# Patient Record
Sex: Female | Born: 1985 | Race: White | Hispanic: No | Marital: Single
Health system: Midwestern US, Community
[De-identification: ages and names within clinical notes are randomized; demographics above are authoritative.]

## PROBLEM LIST (undated history)

## (undated) DIAGNOSIS — I10 Essential (primary) hypertension: Secondary | ICD-10-CM

## (undated) HISTORY — PX: BACK SURGERY: SHX140

---

## 2017-05-02 ENCOUNTER — Emergency Department (HOSPITAL_COMMUNITY)
Admission: EM | Admit: 2017-05-02 | Discharge: 2017-05-02 | Disposition: A | Discharge status: Home / Self Care | Payer: 59 | Attending: Emergency Medicine | Admitting: Emergency Medicine

## 2017-05-02 ENCOUNTER — Other Ambulatory Visit: Payer: Self-pay

## 2017-05-02 ENCOUNTER — Encounter (HOSPITAL_COMMUNITY): Payer: Self-pay | Admitting: *Deleted

## 2017-05-02 DIAGNOSIS — K0889 Other specified disorders of teeth and supporting structures: Secondary | ICD-10-CM | POA: Insufficient documentation

## 2017-05-02 DIAGNOSIS — I10 Essential (primary) hypertension: Secondary | ICD-10-CM | POA: Diagnosis not present

## 2017-05-02 DIAGNOSIS — F17200 Nicotine dependence, unspecified, uncomplicated: Secondary | ICD-10-CM | POA: Insufficient documentation

## 2017-05-02 DIAGNOSIS — K047 Periapical abscess without sinus: Secondary | ICD-10-CM | POA: Diagnosis not present

## 2017-05-02 DIAGNOSIS — R220 Localized swelling, mass and lump, head: Secondary | ICD-10-CM | POA: Diagnosis not present

## 2017-05-02 HISTORY — DX: Essential (primary) hypertension: I10

## 2017-05-02 MED ORDER — OXYCODONE-ACETAMINOPHEN 5-325 MG PO TABS: 1.0000 | ORAL_TABLET | ORAL | 0 refills | Status: DC | PRN

## 2017-05-02 MED ORDER — IBUPROFEN 400 MG PO TABS
400.0000 mg | ORAL_TABLET | Freq: Once | ORAL | Status: AC | PRN
  Administered 2017-05-02: 400 mg via ORAL
  Filled 2017-05-02: qty 1

## 2017-05-02 MED ORDER — OXYCODONE-ACETAMINOPHEN 5-325 MG PO TABS
1.0000 | ORAL_TABLET | Freq: Once | ORAL | Status: AC
  Administered 2017-05-02: 1 via ORAL
  Filled 2017-05-02: qty 1

## 2017-05-02 MED ORDER — CLINDAMYCIN HCL 150 MG PO CAPS: 300.0000 mg | ORAL_CAPSULE | Freq: Three times a day (TID) | ORAL | 0 refills | Status: DC

## 2017-05-02 NOTE — ED Provider Notes (Signed)
[facility]PART[facility] Provider Note   CSN: [id number] Arrival date & time: 05/02/17  4235     History   Chief Complaint Chief Complaint  Patient presents with  . Facial Swelling    HPI Shannon Vang is a 32 y.o. female who presents for evaluation of dental pain and facial swelling.  Patient states that she has 3 broken teeth on the right lower jaw.  She awoke this morning with "grapefruit sized" swelling of the right lower jaw.  She states that the pain was severe.  She denies difficulty swallowing, fever.  She took 800 mg of ibuprofen and a placed ice and the swelling has decreased significantly.  She has a history of previous dental infections.  She is allergic to penicillins.  HPI  Past Medical History:  Diagnosis Date  . Hypertension     There are no active problems to display for this patient.   History reviewed. No pertinent surgical history.   OB History   None      Home Medications    Prior to Admission medications   Not on File    Family History No family history on file.  Social History Social History   Tobacco Use  . Smoking status: Current Every Day Smoker  . Smokeless tobacco: Never Used  Substance Use Topics  . Alcohol use: Yes  . Drug use: Not on file     Allergies   Penicillins   Review of Systems Review of Systems  Ten systems reviewed and are negative for acute change, except as noted in the HPI.   Physical Exam Updated Vital Signs BP (!) 149/106 (BP Location: Left Arm)   Pulse 89   Temp 99.6 F (37.6 C)   Resp 20   Ht 5\' 5"  (1.651 m)   Wt 83.9 kg (185 lb)   LMP 05/01/2017   SpO2 98%   BMI 30.79 kg/m   Physical Exam  Physical Exam  Nursing note and vitals reviewed. Constitutional: She is oriented to person, place, and time. She appears well-developed and well-nourished. No distress.  HENT:  Head: Normocephalic and atraumatic.  Mouth: Cracked right first second and third molar on the  lower jaw with dental decay.  There is significant swelling in the right mandibular region, tenderness to palpation at the apex of the lower gumline.  No overt abscess. Eyes: Conjunctivae normal and EOM are normal. Pupils are equal, round, and reactive to light. No scleral icterus.  Neck: Normal range of motion.  Cardiovascular: Normal rate, regular rhythm and normal heart sounds.  Exam reveals no gallop and no friction rub.   No murmur heard. Pulmonary/Chest: Effort normal and breath sounds normal. No respiratory distress.  Abdominal: Soft. Bowel sounds are normal. She exhibits no distension and no mass. There is no tenderness. There is no guarding.  Neurological: She is alert and oriented to person, place, and time.  Skin: Skin is warm and dry. She is not diaphoretic.    ED Treatments / Results  Labs (all labs ordered are listed, but only abnormal results are displayed) Labs Reviewed - No data to display  EKG None  Radiology No results found.  Procedures Procedures (including critical care time)  Medications Ordered in ED Medications  ibuprofen (ADVIL,MOTRIN) tablet 400 mg (400 mg Oral Given 05/02/17 3614)     Initial Impression / Assessment and Plan / ED Course  I have reviewed the triage vital signs and the nursing notes.  Pertinent labs & imaging  results that were available during my care of the patient were reviewed by me and considered in my medical decision making (see chart for details).     Patient with dental infection no abscess requiring immediate incision and drainage.  Exam not concerning for Ludwig's angina or pharyngeal abscess.  Will treat with clindamycin and Percocet. Pt instructed to follow-up with dentist.  Discussed return precautions. Pt safe for discharge.   Final Clinical Impressions(s) / ED Diagnoses   Final diagnoses:  Dental abscess    ED Discharge Orders    None       Margarita Mail, PA-C 05/02/17 1119    Duffy Bruce,  MD 05/03/17 1331

## 2017-05-02 NOTE — ED Triage Notes (Signed)
THE PT WOKE UP THIS AM WITH SWELLING RT LOWER FACE AND JAW.  SHE TOOK 800 MOTRIN AND THE SWELLING GRADUALLY BECAME SMALLER  SHE HAS 3 BROKEN TEETH ON THE RT LOWER BUT SHE HAS NOT HAD A TOOTHACHE  LMP  YESTERDAY

## 2017-05-02 NOTE — ED Notes (Signed)
Pt tearful. Pt states she is okay going to a hallway bed.

## 2017-05-02 NOTE — Discharge Instructions (Addendum)
You have been seen by your caregiver because of dental pain.  °SEEK MEDICAL ATTENTION IF: °The exam and treatment you received today has been provided on an emergency basis only. This is not a substitute for complete medical or dental care. If your problem worsens or new symptoms (problems) appear, and you are unable to arrange prompt follow-up care with your dentist, call or return to this location. °CALL YOUR DENTIST OR RETURN IMMEDIATELY IF you develop a fever, rash, difficulty breathing or swallowing, neck or facial swelling, or other potentially serious concerns. ° ° °[employer]  °[employer]  °[facility]  °[address]  °[place], [place] [address]  °Phone [phone]  °[facility] [employer] [facility] in [place], [place], exemplifies the Dental School?s vision to improve the health and quality of life of all [place]ians by creating leaders with a passion to care for the underserved and by leading the nation in community-based, service learning oral health education. °We are committed to offering comprehensive general dental services for adults, children and special needs patients in a safe, caring and professional setting. ° °Appointments: Our clinic is open Monday through Friday 8:00 a.m. until 5:00 p.m. The amount of time scheduled for an appointment depends on the patient?s specific needs. We ask that you keep your appointed time for care or provide 24-hour notice of all appointment changes. Parents or legal guardians must accompany minor children. ° °Payment for Services: Medicaid and other insurance plans are welcome. Payment for services is due when services are rendered and may be made by cash or credit card. If you have dental insurance, we will assist you with your claim submission.  ° °Emergencies: Emergency services will be provided Monday through Friday on a  walk-in basis. Please arrive early for emergency services. After hours emergency services will be provided for patients of record as required. ° °Services:  °Comprehensive General Dentistry  °Children?s Dentistry  °Oral Surgery - Extractions  °Root Canals  °Sealants and Tooth Colored Fillings  °Crowns and Bridges  °Dentures and Partial Dentures  °Implant Services  °Periodontal Services and Cleanings  °Cosmetic Tooth Whitening  °Digital Radiography  °3-D/[facility] Imaging ° ° °

## 2017-05-06 DIAGNOSIS — D2371 Other benign neoplasm of skin of right lower limb, including hip: Secondary | ICD-10-CM | POA: Diagnosis not present

## 2017-05-06 DIAGNOSIS — D2362 Other benign neoplasm of skin of left upper limb, including shoulder: Secondary | ICD-10-CM | POA: Diagnosis not present

## 2017-05-06 DIAGNOSIS — D2239 Melanocytic nevi of other parts of face: Secondary | ICD-10-CM | POA: Diagnosis not present

## 2017-05-06 DIAGNOSIS — D485 Neoplasm of uncertain behavior of skin: Secondary | ICD-10-CM | POA: Diagnosis not present

## 2017-05-07 DIAGNOSIS — R112 Nausea with vomiting, unspecified: Secondary | ICD-10-CM | POA: Diagnosis not present

## 2017-05-07 DIAGNOSIS — R197 Diarrhea, unspecified: Secondary | ICD-10-CM | POA: Diagnosis not present

## 2017-07-11 ENCOUNTER — Emergency Department (HOSPITAL_COMMUNITY)
Admission: EM | Admit: 2017-07-11 | Discharge: 2017-07-11 | Disposition: A | Discharge status: Home / Self Care | Payer: 59 | Attending: Emergency Medicine | Admitting: Emergency Medicine

## 2017-07-11 DIAGNOSIS — F17200 Nicotine dependence, unspecified, uncomplicated: Secondary | ICD-10-CM | POA: Insufficient documentation

## 2017-07-11 DIAGNOSIS — I10 Essential (primary) hypertension: Secondary | ICD-10-CM | POA: Diagnosis not present

## 2017-07-11 DIAGNOSIS — K0889 Other specified disorders of teeth and supporting structures: Secondary | ICD-10-CM | POA: Insufficient documentation

## 2017-07-11 MED ORDER — OXYCODONE-ACETAMINOPHEN 5-325 MG PO TABS: 1.0000 | ORAL_TABLET | Freq: Three times a day (TID) | ORAL | 0 refills | Status: DC | PRN

## 2017-07-11 MED ORDER — CLINDAMYCIN HCL 150 MG PO CAPS: 300.0000 mg | ORAL_CAPSULE | Freq: Three times a day (TID) | ORAL | 0 refills | Status: AC

## 2017-07-11 NOTE — ED Provider Notes (Signed)
[facility]PART[facility] Provider Note   CSN: [id number] Arrival date & time: 07/11/17  1216     History   Chief Complaint Chief Complaint  Patient presents with  . Dental Pain    HPI Shannon Vang is a 32 y.o. female with a past medical history of hypertension, who presents to ED for evaluation of acute onset right lower dental pain.  She was at work when she felt her to back molars break off.  She swallowed a piece of the tooth accidentally.  She has had ongoing dental issues.  She last saw her dentist 3 weeks ago.  She is trying to save up enough money for a deposit for several oral surgeries.  She denies any trouble breathing or trouble swallowing, trismus, drooling, fever, drainage from area, neck pain, erythema.  HPI  Past Medical History:  Diagnosis Date  . Hypertension     There are no active problems to display for this patient.   No past surgical history on file.   OB History   None      Home Medications    Prior to Admission medications   Medication Sig Start Date End Date Taking? Authorizing Provider  clindamycin (CLEOCIN) 150 MG capsule Take 2 capsules (300 mg total) by mouth 3 (three) times daily for 7 days. 07/11/17 07/18/17  Scotti Kosta, Nicanor Alcon, PA-C  oxyCODONE-acetaminophen (PERCOCET/ROXICET) 5-325 MG tablet Take 1 tablet by mouth every 8 (eight) hours as needed for severe pain. 07/11/17   Delia Heady, PA-C    Family History No family history on file.  Social History Social History   Tobacco Use  . Smoking status: Current Every Day Smoker  . Smokeless tobacco: Never Used  Substance Use Topics  . Alcohol use: Yes  . Drug use: Not on file     Allergies   Penicillins   Review of Systems Review of Systems  Constitutional: Negative for chills and fever.  HENT: Positive for dental problem. Negative for facial swelling, sore throat and trouble swallowing.   Respiratory: Negative for choking and shortness of breath.     Musculoskeletal: Negative for neck pain and neck stiffness.     Physical Exam Updated Vital Signs BP (!) 151/108 (BP Location: Left Arm)   Pulse 68   Temp 98.6 F (37 C) (Oral)   Resp 16   SpO2 100%   Physical Exam  Constitutional: She appears well-developed and well-nourished. No distress.  HENT:  Head: Normocephalic and atraumatic.  Mouth/Throat: Uvula is midline. She has dentures. No oral lesions. Abnormal dentition. No dental abscesses, uvula swelling or dental caries.    Overall poor dentition with several missing and chipped teeth noted. Lateral side of two back R lower molars are chipped. No gross dental abscess or site of drainage at this time. No facial, neck or cheek swelling noted. No pooling of secretions or trismus.  Normal voice noted with no difficulty swallowing or breathing. No submandibular swelling, erythema or crepitus noted.  Eyes: Conjunctivae and EOM are normal. No scleral icterus.  Neck: Normal range of motion.  Pulmonary/Chest: Effort normal. No respiratory distress.  Neurological: She is alert.  Skin: No rash noted. She is not diaphoretic.  Psychiatric: She has a normal mood and affect.  Nursing note and vitals reviewed.    ED Treatments / Results  Labs (all labs ordered are listed, but only abnormal results are displayed) Labs Reviewed - No data to display  EKG None  Radiology No results found.  Procedures  Dental Block Date/Time: 07/11/2017 1:39 PM Performed by: Delia Heady, PA-C Authorized by: Delia Heady, PA-C   Consent:    Consent obtained:  Verbal   Consent given by:  Patient   Risks discussed:  Allergic reaction, hematoma, infection, intravascular injection, nerve damage, pain, swelling and unsuccessful block Indications:    Indications: dental pain   Location:    Block type:  Inferior alveolar Procedure details (see MAR for exact dosages):    Syringe type:  Controlled syringe   Anesthetic injected:  Bupivacaine 0.5% WITH  epi   Injection procedure:  Anatomic landmarks identified Post-procedure details:    Outcome:  Anesthesia achieved   Patient tolerance of procedure:  Tolerated well, no immediate complications   (including critical care time)  Medications Ordered in ED Medications - No data to display   Initial Impression / Assessment and Plan / ED Course  I have reviewed the triage vital signs and the nursing notes.  Pertinent labs & imaging results that were available during my care of the patient were reviewed by me and considered in my medical decision making (see chart for details).     Patient with dentalgia. On exam, there is no evidence of a drainable abscess. No trismus, glossal elevation, unilateral tonsillar swelling. No evidence of retropharyngeal or peritonsillar abscess or Ludwig angina.   Patient given dental block with significant improvement in symptoms.  Will treat with  clindamycin and short course of pain medication. Pt instructed to follow-up with dentist as soon as possible.   Portions of this note were generated with [employer]. Dictation errors may occur despite best attempts at proofreading.   Final Clinical Impressions(s) / ED Diagnoses   Final diagnoses:  Pain, dental    ED Discharge Orders        Ordered    clindamycin (CLEOCIN) 150 MG capsule  3 times daily     07/11/17 1318    oxyCODONE-acetaminophen (PERCOCET/ROXICET) 5-325 MG tablet  Every 8 hours PRN     07/11/17 1318       Delia Heady, PA-C 07/11/17 1341    Carmin Muskrat, MD 07/12/17 1349

## 2017-07-11 NOTE — ED Triage Notes (Signed)
Patient to ED c/o R upper dental pain - reports she was at work and felt two back molars break off at the gum line with pain since. Denies trauma.

## 2017-07-11 NOTE — Discharge Instructions (Signed)
Return to ED for worsening symptoms trouble breathing or trouble swallowing, increased facial swelling, coughing up blood, chest pain or trouble moving.

## 2017-08-06 ENCOUNTER — Ambulatory Visit (HOSPITAL_COMMUNITY)
Admission: RE | Admit: 2017-08-06 | Discharge: 2017-08-06 | Disposition: A | Discharge status: Home / Self Care | Payer: 59 | Source: Ambulatory Visit | Attending: Family Medicine | Admitting: Family Medicine

## 2017-08-06 ENCOUNTER — Other Ambulatory Visit (HOSPITAL_COMMUNITY): Payer: Self-pay | Admitting: Family Medicine

## 2017-08-06 DIAGNOSIS — S99921A Unspecified injury of right foot, initial encounter: Secondary | ICD-10-CM | POA: Diagnosis not present

## 2017-08-06 DIAGNOSIS — M79674 Pain in right toe(s): Secondary | ICD-10-CM

## 2017-08-06 DIAGNOSIS — W208XXA Other cause of strike by thrown, projected or falling object, initial encounter: Secondary | ICD-10-CM | POA: Diagnosis not present

## 2017-08-23 ENCOUNTER — Other Ambulatory Visit: Payer: Self-pay | Admitting: Occupational Medicine

## 2017-08-23 ENCOUNTER — Ambulatory Visit: Payer: Self-pay

## 2017-08-23 DIAGNOSIS — M79674 Pain in right toe(s): Secondary | ICD-10-CM

## 2017-10-14 DIAGNOSIS — F1120 Opioid dependence, uncomplicated: Secondary | ICD-10-CM | POA: Diagnosis not present

## 2017-10-28 DIAGNOSIS — F1120 Opioid dependence, uncomplicated: Secondary | ICD-10-CM | POA: Diagnosis not present

## 2017-11-01 ENCOUNTER — Emergency Department (HOSPITAL_COMMUNITY): Payer: 59

## 2017-11-01 ENCOUNTER — Encounter (HOSPITAL_COMMUNITY): Payer: Self-pay | Admitting: *Deleted

## 2017-11-01 ENCOUNTER — Emergency Department (HOSPITAL_COMMUNITY)
Admission: EM | Admit: 2017-11-01 | Discharge: 2017-11-01 | Disposition: A | Discharge status: Home / Self Care | Payer: 59 | Attending: Emergency Medicine | Admitting: Emergency Medicine

## 2017-11-01 ENCOUNTER — Other Ambulatory Visit: Payer: Self-pay

## 2017-11-01 DIAGNOSIS — K0889 Other specified disorders of teeth and supporting structures: Secondary | ICD-10-CM | POA: Diagnosis not present

## 2017-11-01 DIAGNOSIS — J189 Pneumonia, unspecified organism: Secondary | ICD-10-CM | POA: Diagnosis not present

## 2017-11-01 DIAGNOSIS — J181 Lobar pneumonia, unspecified organism: Secondary | ICD-10-CM | POA: Insufficient documentation

## 2017-11-01 DIAGNOSIS — R221 Localized swelling, mass and lump, neck: Secondary | ICD-10-CM | POA: Diagnosis not present

## 2017-11-01 LAB — BASIC METABOLIC PANEL
Anion gap: 12 (ref 5–15)
BUN: 8 mg/dL (ref 6–20)
CHLORIDE: 100 mmol/L (ref 98–111)
CO2: 25 mmol/L (ref 22–32)
Calcium: 9.2 mg/dL (ref 8.9–10.3)
Creatinine, Ser: 0.71 mg/dL (ref 0.44–1.00)
GFR calc non Af Amer: >60 mL/min (ref >60)
GLUCOSE: 93 mg/dL (ref 70–99)
Potassium: 3.4 mmol/L — ABNORMAL LOW (ref 3.5–5.1)
Sodium: 137 mmol/L (ref 135–145)

## 2017-11-01 LAB — CBC WITH DIFFERENTIAL/PLATELET
Abs Immature Granulocytes: 0.03 10*3/uL (ref 0.00–0.07)
BASOS ABS: 0.1 10*3/uL (ref 0.0–0.1)
Basophils Relative: 1 %
EOS PCT: 3 %
Eosinophils Absolute: 0.3 10*3/uL (ref 0.0–0.5)
HEMATOCRIT: 38.6 % (ref 36.0–46.0)
HEMOGLOBIN: 11.8 g/dL — AB (ref 12.0–15.0)
Immature Granulocytes: 0 %
LYMPHS ABS: 3.2 10*3/uL (ref 0.7–4.0)
LYMPHS PCT: 32 %
MCH: 26.3 pg (ref 26.0–34.0)
MCHC: 30.6 g/dL (ref 30.0–36.0)
MCV: 86.2 fL (ref 80.0–100.0)
MONO ABS: 0.5 10*3/uL (ref 0.1–1.0)
MONOS PCT: 6 %
Neutro Abs: 5.9 10*3/uL (ref 1.7–7.7)
Neutrophils Relative %: 58 %
Platelets: 438 10*3/uL — ABNORMAL HIGH (ref 150–400)
RBC: 4.48 MIL/uL (ref 3.87–5.11)
RDW: 13.4 % (ref 11.5–15.5)
WBC: 9.9 10*3/uL (ref 4.0–10.5)
nRBC: 0 % (ref 0.0–0.2)

## 2017-11-01 LAB — I-STAT BETA HCG BLOOD, ED (MC, WL, AP ONLY)

## 2017-11-01 LAB — I-STAT CREATININE, ED: Creatinine, Ser: 0.6 mg/dL (ref 0.44–1.00)

## 2017-11-01 MED ORDER — IOHEXOL 300 MG/ML  SOLN
100.0000 mL | Freq: Once | INTRAMUSCULAR | Status: AC | PRN
  Administered 2017-11-01: 75 mL via INTRAVENOUS

## 2017-11-01 MED ORDER — AMOXICILLIN-POT CLAVULANATE 875-125 MG PO TABS: 1.0000 | ORAL_TABLET | Freq: Two times a day (BID) | ORAL | 0 refills | Status: DC

## 2017-11-01 MED ORDER — OXYCODONE-ACETAMINOPHEN 5-325 MG PO TABS: 1.0000 | ORAL_TABLET | Freq: Four times a day (QID) | ORAL | 0 refills | Status: DC | PRN

## 2017-11-01 MED ORDER — KETOROLAC TROMETHAMINE 30 MG/ML IJ SOLN
15.0000 mg | Freq: Once | INTRAMUSCULAR | Status: AC
Start: 2017-11-01 — End: 2017-11-01
  Administered 2017-11-01: 15 mg via INTRAVENOUS
  Filled 2017-11-01: qty 1

## 2017-11-01 NOTE — ED Triage Notes (Signed)
Pt reports right side dental pain, now has swelling to her jaw and feels like it is spreading further down and now has difficulty swallowing. Airway intact.

## 2017-11-01 NOTE — ED Provider Notes (Signed)
Patient placed in Quick Look pathway, seen and evaluated   Chief Complaint: Dental pain  HPI:   Patient presents today for evaluation of dental pain.  She has a plan with her oral surgeon to have multiple teeth moved.  She is not currently on any antibiotics.  Over the past few days she has had worsening swelling on the right lower jaw.  She denies fevers.  She reports feeling like she has a tennis ball in her throat and significant pain with swallowing.  ROS: No fevers (one)  Physical Exam:   Gen: No distress  Neuro: Awake and Alert  Skin: Warm    Focused Exam: Broken tooth on left mandible.  There is edema under the jaw.  Mild trismus.  No stridor.    Initiation of care has begun. The patient has been counseled on the process, plan, and necessity for staying for the completion/evaluation, and the remainder of the medical screening examination    Ollen Gross 11/01/17 1913    Gareth Morgan, MD 11/03/17 2133

## 2017-11-01 NOTE — Discharge Instructions (Addendum)
Please read attached information. If you experience any new or worsening signs or symptoms please return to the emergency room for evaluation. Please follow-up with your primary care provider or specialist as discussed. Please use medication prescribed only as directed and discontinue taking if you have any concerning signs or symptoms.   °

## 2017-11-02 MED FILL — AMOX-CLAV 875-125 MG TABLET: 875-125 | 7 days supply | Qty: 14 | Fill #0

## 2017-11-02 MED FILL — OXYCODONE-ACETAMINOPHEN 5-3: 5-325 | 2 days supply | Qty: 6 | Fill #0

## 2017-11-08 MED FILL — ACETAMINOPHEN/COD #3 TABLET: 300-30 | 3 days supply | Qty: 20 | Fill #0

## 2017-11-08 MED FILL — CLINDAMYCIN HCL 300 MG CAP: 300 | 7 days supply | Qty: 28 | Fill #0

## 2017-11-08 MED FILL — IBUPROFEN 800 MG TAB: 800 | 10 days supply | Qty: 30 | Fill #0

## 2017-11-09 MED FILL — HYDROCODON-APAP 5-325: 5-325 | 3 days supply | Qty: 20 | Fill #0

## 2017-11-11 DIAGNOSIS — F1120 Opioid dependence, uncomplicated: Secondary | ICD-10-CM | POA: Diagnosis not present

## 2017-11-12 DIAGNOSIS — F1120 Opioid dependence, uncomplicated: Secondary | ICD-10-CM | POA: Diagnosis not present

## 2017-11-14 DIAGNOSIS — F1120 Opioid dependence, uncomplicated: Secondary | ICD-10-CM | POA: Diagnosis not present

## 2017-11-21 DIAGNOSIS — F1120 Opioid dependence, uncomplicated: Secondary | ICD-10-CM | POA: Diagnosis not present

## 2017-11-25 DIAGNOSIS — F1120 Opioid dependence, uncomplicated: Secondary | ICD-10-CM | POA: Diagnosis not present

## 2017-12-05 DIAGNOSIS — F1120 Opioid dependence, uncomplicated: Secondary | ICD-10-CM | POA: Diagnosis not present

## 2017-12-09 DIAGNOSIS — F1120 Opioid dependence, uncomplicated: Secondary | ICD-10-CM | POA: Diagnosis not present

## 2017-12-19 DIAGNOSIS — F1120 Opioid dependence, uncomplicated: Secondary | ICD-10-CM | POA: Diagnosis not present

## 2017-12-23 DIAGNOSIS — F1120 Opioid dependence, uncomplicated: Secondary | ICD-10-CM | POA: Diagnosis not present

## 2018-01-09 DIAGNOSIS — F1120 Opioid dependence, uncomplicated: Secondary | ICD-10-CM | POA: Diagnosis not present

## 2018-01-16 DIAGNOSIS — F1120 Opioid dependence, uncomplicated: Secondary | ICD-10-CM | POA: Diagnosis not present

## 2018-01-23 DIAGNOSIS — F1120 Opioid dependence, uncomplicated: Secondary | ICD-10-CM | POA: Diagnosis not present

## 2018-01-30 DIAGNOSIS — F1120 Opioid dependence, uncomplicated: Secondary | ICD-10-CM | POA: Diagnosis not present

## 2018-02-01 ENCOUNTER — Encounter (HOSPITAL_COMMUNITY): Payer: Self-pay

## 2018-02-01 ENCOUNTER — Ambulatory Visit (HOSPITAL_COMMUNITY)
Admission: EM | Admit: 2018-02-01 | Discharge: 2018-02-01 | Disposition: A | Discharge status: Home / Self Care | Payer: 59 | Attending: Family Medicine | Admitting: Family Medicine

## 2018-02-01 DIAGNOSIS — K047 Periapical abscess without sinus: Secondary | ICD-10-CM | POA: Insufficient documentation

## 2018-02-01 MED ORDER — OXYCODONE-ACETAMINOPHEN 5-325 MG PO TABS: 1.0000 | ORAL_TABLET | Freq: Four times a day (QID) | ORAL | 0 refills | Status: DC | PRN

## 2018-02-01 MED ORDER — CEFDINIR 300 MG PO CAPS: 600.0000 mg | ORAL_CAPSULE | Freq: Every day | ORAL | 0 refills | Status: DC

## 2018-02-01 MED FILL — OXYCODONE-ACETAMINOPHEN 5-3: 5-325 | 2 days supply | Qty: 8 | Fill #0

## 2018-02-01 MED FILL — CEFDINIR 300 MG CAPSULE: 300 | 10 days supply | Qty: 20 | Fill #0

## 2018-02-01 NOTE — Discharge Instructions (Addendum)
Keep your appointment with [facility] on Thursday

## 2018-02-01 NOTE — ED Triage Notes (Signed)
Pt present abscess on both sides of her mouth from tooth pain. Symptoms started on Sunday evening. Pt has tried OTC medication with no relief.

## 2018-02-01 NOTE — ED Provider Notes (Signed)
[facility]    CSN: [id number] Arrival date & time: 02/01/18  1112     History   Chief Complaint Chief Complaint  Patient presents with  . Abscess    Dental problems    HPI Shannon Vang is a 33 y.o. female.   33 yo woman making her initial visit to [facility], this time for dental problem.  Allergic to PCN.  Patient had 2 teeth extracted from her right lower jaw couple weeks ago.  She seemed to be getting better until last night when she developed increasing swelling and pain in the lower jaw molar area.  She can barely talk now, having difficulty opening up her mouth.  She called her oral surgeon but they refused to see her until Thursday.     Past Medical History:  Diagnosis Date  . Hypertension     There are no active problems to display for this patient.   History reviewed. No pertinent surgical history.  OB History   No obstetric history on file.      Home Medications    Prior to Admission medications   Medication Sig Start Date End Date Taking? Authorizing Provider  cefdinir (OMNICEF) 300 MG capsule Take 2 capsules (600 mg total) by mouth daily. 02/01/18   Robyn Haber, MD  oxyCODONE-acetaminophen (PERCOCET/ROXICET) 5-325 MG tablet Take 1 tablet by mouth every 6 (six) hours as needed for severe pain. 02/01/18   Robyn Haber, MD    Family History History reviewed. No pertinent family history.  Social History Social History   Tobacco Use  . Smoking status: Current Every Day Smoker  . Smokeless tobacco: Never Used  Substance Use Topics  . Alcohol use: Yes  . Drug use: Not on file     Allergies   Penicillins   Review of Systems Review of Systems   Physical Exam Triage Vital Signs ED Triage Vitals  Enc Vitals Group     BP 02/01/18 1207 (!) 133/94     Pulse Rate 02/01/18 1207 (!) 134     Resp 02/01/18 1207 16     Temp 02/01/18 1207 99.8 F (37.7 C)     Temp Source 02/01/18 1207 Oral     SpO2 02/01/18 1207 100 %   Weight --      Height --      Head Circumference --      Peak Flow --      Pain Score 02/01/18 1208 9     Pain Loc --      Pain Edu? --      Excl. in [place]? --    No data found.  Updated Vital Signs BP (!) 133/94 (BP Location: Right Arm)   Pulse (!) 134   Temp 99.8 F (37.7 C) (Oral)   Resp 16   LMP 01/05/2018   SpO2 100%    Physical Exam Vitals signs and nursing note reviewed.  Constitutional:      General: She is in acute distress.     Appearance: She is not toxic-appearing.  HENT:     Head:     Comments: Swollen right jaw with trismus    Right Ear: Tympanic membrane normal.     Nose: Nose normal.     Mouth/Throat:     Mouth: Mucous membranes are moist.     Pharynx: Oropharynx is clear. Posterior oropharyngeal erythema present.  Eyes:     Conjunctiva/sclera: Conjunctivae normal.  Cardiovascular:     Rate and Rhythm: Normal rate.  Pulmonary:     Effort: Pulmonary effort is normal.  Musculoskeletal: Normal range of motion.  Skin:    General: Skin is warm and dry.  Neurological:     General: No focal deficit present.     Mental Status: She is alert and oriented to person, place, and time.      UC Treatments / Results  Labs (all labs ordered are listed, but only abnormal results are displayed) Labs Reviewed - No data to display  EKG None  Radiology No results found.  Procedures Procedures (including critical care time)  Medications Ordered in UC Medications - No data to display  Initial Impression / Assessment and Plan / UC Course  I have reviewed the triage vital signs and the nursing notes.  Pertinent labs & imaging results that were available during my care of the patient were reviewed by me and considered in my medical decision making (see chart for details).    Final Clinical Impressions(s) / UC Diagnoses   Final diagnoses:  Dental abscess     Discharge Instructions     Keep your appointment with [facility] on Thursday    ED  Prescriptions    Medication Sig Dispense Auth. Provider   oxyCODONE-acetaminophen (PERCOCET/ROXICET) 5-325 MG tablet Take 1 tablet by mouth every 6 (six) hours as needed for severe pain. 8 tablet Robyn Haber, MD   cefdinir (OMNICEF) 300 MG capsule Take 2 capsules (600 mg total) by mouth daily. 20 capsule Robyn Haber, MD     Controlled Substance Prescriptions [place] Controlled Substance Registry consulted? Not Applicable   Robyn Haber, MD 02/01/18 1225

## 2018-02-04 DIAGNOSIS — D2371 Other benign neoplasm of skin of right lower limb, including hip: Secondary | ICD-10-CM | POA: Diagnosis not present

## 2018-02-06 DIAGNOSIS — F1120 Opioid dependence, uncomplicated: Secondary | ICD-10-CM | POA: Diagnosis not present

## 2018-02-13 DIAGNOSIS — F1120 Opioid dependence, uncomplicated: Secondary | ICD-10-CM | POA: Diagnosis not present

## 2018-02-17 DIAGNOSIS — F1120 Opioid dependence, uncomplicated: Secondary | ICD-10-CM | POA: Diagnosis not present

## 2018-02-20 DIAGNOSIS — F1120 Opioid dependence, uncomplicated: Secondary | ICD-10-CM | POA: Diagnosis not present

## 2018-02-27 DIAGNOSIS — F1120 Opioid dependence, uncomplicated: Secondary | ICD-10-CM | POA: Diagnosis not present

## 2018-03-03 ENCOUNTER — Emergency Department (HOSPITAL_COMMUNITY): Payer: 59

## 2018-03-03 ENCOUNTER — Observation Stay (HOSPITAL_COMMUNITY): Payer: 59

## 2018-03-03 ENCOUNTER — Encounter: Payer: Self-pay | Admitting: Emergency Medicine

## 2018-03-03 ENCOUNTER — Observation Stay (HOSPITAL_COMMUNITY)
Admission: EM | Admit: 2018-03-03 | Discharge: 2018-03-04 | Disposition: A | Discharge status: Home / Self Care | Payer: 59 | Attending: Family Medicine | Admitting: Family Medicine

## 2018-03-03 DIAGNOSIS — G039 Meningitis, unspecified: Secondary | ICD-10-CM | POA: Diagnosis not present

## 2018-03-03 DIAGNOSIS — Z79899 Other long term (current) drug therapy: Secondary | ICD-10-CM | POA: Diagnosis not present

## 2018-03-03 DIAGNOSIS — R509 Fever, unspecified: Secondary | ICD-10-CM | POA: Diagnosis not present

## 2018-03-03 DIAGNOSIS — M436 Torticollis: Secondary | ICD-10-CM | POA: Diagnosis present

## 2018-03-03 DIAGNOSIS — R000 Tachycardia, unspecified: Secondary | ICD-10-CM | POA: Diagnosis not present

## 2018-03-03 DIAGNOSIS — M542 Cervicalgia: Secondary | ICD-10-CM

## 2018-03-03 DIAGNOSIS — R7989 Other specified abnormal findings of blood chemistry: Secondary | ICD-10-CM | POA: Insufficient documentation

## 2018-03-03 DIAGNOSIS — Z8673 Personal history of transient ischemic attack (TIA), and cerebral infarction without residual deficits: Secondary | ICD-10-CM | POA: Diagnosis not present

## 2018-03-03 DIAGNOSIS — I10 Essential (primary) hypertension: Secondary | ICD-10-CM | POA: Diagnosis not present

## 2018-03-03 DIAGNOSIS — Z880 Allergy status to penicillin: Secondary | ICD-10-CM | POA: Insufficient documentation

## 2018-03-03 DIAGNOSIS — R51 Headache: Secondary | ICD-10-CM

## 2018-03-03 DIAGNOSIS — Z8249 Family history of ischemic heart disease and other diseases of the circulatory system: Secondary | ICD-10-CM | POA: Insufficient documentation

## 2018-03-03 DIAGNOSIS — F17200 Nicotine dependence, unspecified, uncomplicated: Secondary | ICD-10-CM | POA: Diagnosis not present

## 2018-03-03 DIAGNOSIS — D72829 Elevated white blood cell count, unspecified: Secondary | ICD-10-CM | POA: Diagnosis not present

## 2018-03-03 DIAGNOSIS — R519 Headache, unspecified: Secondary | ICD-10-CM

## 2018-03-03 DIAGNOSIS — M2560 Stiffness of unspecified joint, not elsewhere classified: Secondary | ICD-10-CM | POA: Diagnosis not present

## 2018-03-03 DIAGNOSIS — R69 Illness, unspecified: Secondary | ICD-10-CM | POA: Diagnosis not present

## 2018-03-03 DIAGNOSIS — G43909 Migraine, unspecified, not intractable, without status migrainosus: Secondary | ICD-10-CM | POA: Diagnosis not present

## 2018-03-03 DIAGNOSIS — I4891 Unspecified atrial fibrillation: Secondary | ICD-10-CM | POA: Insufficient documentation

## 2018-03-03 LAB — COMPREHENSIVE METABOLIC PANEL
ALBUMIN: 3.8 g/dL (ref 3.5–5.0)
ALT: 17 U/L (ref 0–44)
AST: 19 U/L (ref 15–41)
Alkaline Phosphatase: 87 U/L (ref 38–126)
Anion gap: 10 (ref 5–15)
BILIRUBIN TOTAL: 0.8 mg/dL (ref 0.3–1.2)
BUN: 11 mg/dL (ref 6–20)
CHLORIDE: 101 mmol/L (ref 98–111)
CO2: 24 mmol/L (ref 22–32)
Calcium: 9.2 mg/dL (ref 8.9–10.3)
Creatinine, Ser: 0.81 mg/dL (ref 0.44–1.00)
GFR calc Af Amer: >60 mL/min (ref >60)
GFR calc non Af Amer: >60 mL/min (ref >60)
GLUCOSE: 104 mg/dL — AB (ref 70–99)
POTASSIUM: 3.5 mmol/L (ref 3.5–5.1)
SODIUM: 135 mmol/L (ref 135–145)
Total Protein: 6.8 g/dL (ref 6.5–8.1)

## 2018-03-03 LAB — CBC WITH DIFFERENTIAL/PLATELET
Abs Immature Granulocytes: 0.08 10*3/uL — ABNORMAL HIGH (ref 0.00–0.07)
Basophils Absolute: 0.1 10*3/uL (ref 0.0–0.1)
Basophils Relative: 0 %
Eosinophils Absolute: 0.2 10*3/uL (ref 0.0–0.5)
Eosinophils Relative: 1 %
HEMATOCRIT: 40.9 % (ref 36.0–46.0)
Hemoglobin: 13.1 g/dL (ref 12.0–15.0)
IMMATURE GRANULOCYTES: 0 %
LYMPHS ABS: 3.6 10*3/uL (ref 0.7–4.0)
Lymphocytes Relative: 20 %
MCH: 26.7 pg (ref 26.0–34.0)
MCHC: 32 g/dL (ref 30.0–36.0)
MCV: 83.5 fL (ref 80.0–100.0)
MONO ABS: 0.7 10*3/uL (ref 0.1–1.0)
MONOS PCT: 4 %
NEUTROS PCT: 75 %
Neutro Abs: 13.8 10*3/uL — ABNORMAL HIGH (ref 1.7–7.7)
PLATELETS: 449 10*3/uL — AB (ref 150–400)
RBC: 4.9 MIL/uL (ref 3.87–5.11)
RDW: 13.3 % (ref 11.5–15.5)
WBC: 18.5 10*3/uL — ABNORMAL HIGH (ref 4.0–10.5)
nRBC: 0 % (ref 0.0–0.2)

## 2018-03-03 LAB — URINALYSIS, ROUTINE W REFLEX MICROSCOPIC
Bilirubin Urine: NEGATIVE
GLUCOSE, UA: NEGATIVE mg/dL
HGB URINE DIPSTICK: NEGATIVE
Ketones, ur: NEGATIVE mg/dL
Leukocytes,Ua: NEGATIVE
Nitrite: NEGATIVE
Protein, ur: NEGATIVE mg/dL
Specific Gravity, Urine: 1.02 (ref 1.005–1.030)
pH: 5 (ref 5.0–8.0)

## 2018-03-03 LAB — LACTIC ACID, PLASMA: Lactic Acid, Venous: 1.9 mmol/L (ref 0.5–1.9)

## 2018-03-03 LAB — PREGNANCY, URINE: Preg Test, Ur: NEGATIVE

## 2018-03-03 LAB — LIPASE, BLOOD: Lipase: 21 U/L (ref 11–51)

## 2018-03-03 MED ORDER — SODIUM CHLORIDE 0.9 % IV SOLN
2.0000 g | Freq: Once | INTRAVENOUS | Status: AC
  Administered 2018-03-04: 2 g via INTRAVENOUS
  Filled 2018-03-03: qty 20

## 2018-03-03 MED ORDER — PROCHLORPERAZINE EDISYLATE 10 MG/2ML IJ SOLN
10.0000 mg | Freq: Once | INTRAMUSCULAR | Status: AC
  Administered 2018-03-03: 10 mg via INTRAVENOUS
  Filled 2018-03-03: qty 2

## 2018-03-03 MED ORDER — ACETAMINOPHEN 325 MG PO TABS: 650.0000 mg | ORAL_TABLET | Freq: Four times a day (QID) | ORAL | Status: DC | PRN

## 2018-03-03 MED ORDER — VANCOMYCIN HCL IN DEXTROSE 1-5 GM/200ML-% IV SOLN: 1000.0000 mg | Freq: Once | INTRAVENOUS | Status: DC

## 2018-03-03 MED ORDER — ONDANSETRON HCL 4 MG/2ML IJ SOLN: 4.0000 mg | Freq: Four times a day (QID) | INTRAMUSCULAR | Status: DC | PRN

## 2018-03-03 MED ORDER — BISACODYL 5 MG PO TBEC: 5.0000 mg | DELAYED_RELEASE_TABLET | Freq: Every day | ORAL | Status: DC | PRN

## 2018-03-03 MED ORDER — ONDANSETRON HCL 4 MG PO TABS: 4.0000 mg | ORAL_TABLET | Freq: Four times a day (QID) | ORAL | Status: DC | PRN

## 2018-03-03 MED ORDER — VANCOMYCIN HCL 10 G IV SOLR
2000.0000 mg | Freq: Once | INTRAVENOUS | Status: AC
  Administered 2018-03-03: 2000 mg via INTRAVENOUS
  Filled 2018-03-03: qty 2000

## 2018-03-03 MED ORDER — HYDRALAZINE HCL 20 MG/ML IJ SOLN: 10.0000 mg | INTRAMUSCULAR | Status: DC | PRN

## 2018-03-03 MED ORDER — HYDROMORPHONE HCL 2 MG PO TABS: 4.0000 mg | ORAL_TABLET | Freq: Four times a day (QID) | ORAL | Status: DC | PRN

## 2018-03-03 MED ORDER — SODIUM CHLORIDE 0.9 % IV BOLUS
1000.0000 mL | Freq: Once | INTRAVENOUS | Status: AC
  Administered 2018-03-03: 1000 mL via INTRAVENOUS

## 2018-03-03 MED ORDER — SODIUM CHLORIDE 0.9 % IV SOLN
2.0000 g | Freq: Two times a day (BID) | INTRAVENOUS | Status: DC
  Filled 2018-03-03: qty 20

## 2018-03-03 MED ORDER — POTASSIUM CHLORIDE IN NACL 20-0.45 MEQ/L-% IV SOLN: INTRAVENOUS | Status: DC

## 2018-03-03 MED ORDER — IOPAMIDOL (ISOVUE-370) INJECTION 76%
75.0000 mL | Freq: Once | INTRAVENOUS | Status: AC | PRN
  Administered 2018-03-03: 75 mL via INTRAVENOUS

## 2018-03-03 MED ORDER — SODIUM CHLORIDE 0.9 % IV BOLUS
500.0000 mL | Freq: Once | INTRAVENOUS | Status: AC
  Administered 2018-03-04: 500 mL via INTRAVENOUS

## 2018-03-03 MED ORDER — ACETAMINOPHEN 650 MG RE SUPP: 650.0000 mg | Freq: Four times a day (QID) | RECTAL | Status: DC | PRN

## 2018-03-03 MED ORDER — DIPHENHYDRAMINE HCL 50 MG/ML IJ SOLN
25.0000 mg | Freq: Once | INTRAMUSCULAR | Status: AC
  Administered 2018-03-03: 25 mg via INTRAVENOUS
  Filled 2018-03-03: qty 1

## 2018-03-03 MED ORDER — POLYETHYLENE GLYCOL 3350 17 G PO PACK: 17.0000 g | PACK | Freq: Every day | ORAL | Status: DC | PRN

## 2018-03-03 MED ORDER — DEXAMETHASONE SODIUM PHOSPHATE 10 MG/ML IJ SOLN
10.0000 mg | Freq: Four times a day (QID) | INTRAMUSCULAR | Status: DC
  Administered 2018-03-03 – 2018-03-04 (×2): 10 mg via INTRAVENOUS
  Filled 2018-03-03 (×2): qty 1

## 2018-03-03 MED ORDER — POTASSIUM CHLORIDE IN NACL 20-0.45 MEQ/L-% IV SOLN
INTRAVENOUS | Status: DC
  Administered 2018-03-04: 06:00:00 via INTRAVENOUS
  Filled 2018-03-03 (×2): qty 1000

## 2018-03-03 MED ORDER — VANCOMYCIN HCL IN DEXTROSE 1-5 GM/200ML-% IV SOLN
1000.0000 mg | Freq: Three times a day (TID) | INTRAVENOUS | Status: DC
  Administered 2018-03-04: 1000 mg via INTRAVENOUS
  Filled 2018-03-03: qty 200

## 2018-03-03 MED ORDER — HYDROCODONE-ACETAMINOPHEN 5-325 MG PO TABS: 1.0000 | ORAL_TABLET | ORAL | Status: DC | PRN

## 2018-03-03 NOTE — ED Notes (Signed)
Pt comes with paperwork of all labs drawn today

## 2018-03-03 NOTE — H&P (Signed)
History and Physical    Shannon Vang NTI:[id number] DOB: [birth year] DOA: 03/03/2018  PCP: Dineen Kid, MD   Patient coming from: Home   Chief Complaint: headache, neck stiffness, fevers   HPI: Shannon Vang is a 33 y.o. female with medical history significant for hypertension not currently on any medications, and chronic low back pain status post multiple surgeries and eventual fusion of the lumbosacral spine, now presenting to emergency department for evaluation of fevers, headache, and neck stiffness.  Patient reports that she been in her usual state of health until developing some fatigue, malaise, mild rhinorrhea and sinus congestion on 02/27/2018.  She developed nausea with nonbloody vomiting the following day, checked her temperature, and reports fever to 103 F.  She then developed headache and neck stiffness early this morning. She has been treating her headaches successfully with NSAID and Tylenol, continues to have some mild nausea and nonbloody vomiting, denies abdominal pain or diarrhea, and denies any change in vision or hearing, focal numbness or weakness, confusion, or seizures.  ED Course: Upon arrival to the ED, patient is found to be afebrile, saturating well on room air, tachycardic in the 120s, and hypertensive with DBP of 110.  EKG features normal sinus rhythm, chest x-ray is negative for acute cardiopulmonary disease, and CTA head and neck is a normal study.  Chemistry panel is unremarkable and CBC notable for leukocytosis to 18,500 and thrombocytosis with platelets 149,000.  Blood and urine cultures were collected in the ED.  ED physician discussed with neurology who recommended LP and empiric antibiotics for meningitis.  Patient has history of lumbar spinal fusion and ED physician did not attempt LP for this reason.  Patient was started on Decadron, Rocephin, and vancomycin in the ED.  She will be observed for further evaluation and management.  Review of Systems:  All other  systems reviewed and apart from HPI, are negative.  Past Medical History:  Diagnosis Date  . Hypertension     History reviewed. No pertinent surgical history.   reports that she has been smoking. She has never used smokeless tobacco. She reports current alcohol use. No history on file for drug.  Allergies  Allergen Reactions  . Penicillins Rash    Family History  Problem Relation Age of Onset  . Hypertension Other      Prior to Admission medications   Not on File    Physical Exam: Vitals:   03/03/18 1425 03/03/18 1432 03/03/18 1614  BP: (!) 137/109  (!) 124/105  Pulse: (!) 127  (!) 116  Resp: 20  18  Temp: 98.5 F (36.9 C)  98 F (36.7 C)  TempSrc: Oral  Oral  SpO2: 97%  97%  Weight:  95.3 kg   Height:  5\' 6"  (1.676 m)      Constitutional: NAD, calm  Eyes: PERTLA, lids and conjunctivae normal ENMT: Mucous membranes are moist. Posterior pharynx clear of any exudate or lesions.   Neck: normal, supple, no masses, no thyromegaly Respiratory: clear to auscultation bilaterally, no wheezing, no crackles. Normal respiratory effort.   Cardiovascular: Rate ~110 and regular. No extremity edema.   Abdomen: No distension, no tenderness, soft. Bowel sounds normal.  Musculoskeletal: no clubbing / cyanosis. No joint deformity upper and lower extremities.  Skin: no significant rashes, lesions, ulcers. Warm, dry, well-perfused. Neurologic: CN 2-12 grossly intact. Sensation intact, DTR normal. Strength 5/5 in all 4 limbs.  Psychiatric: Alert and oriented x 3. Very pleasant, cooperative.    Labs on Admission: I  have personally reviewed following labs and imaging studies  CBC: Recent Labs  Lab 03/03/18 1722  WBC 18.5*  NEUTROABS 13.8*  HGB 13.1  HCT 40.9  MCV 83.5  PLT 423*   Basic Metabolic Panel: Recent Labs  Lab 03/03/18 1722  NA 135  K 3.5  CL 101  CO2 24  GLUCOSE 104*  BUN 11  CREATININE 0.81  CALCIUM 9.2   GFR: Estimated Creatinine Clearance: 114.9  mL/min (by C-G formula based on SCr of 0.81 mg/dL). Liver Function Tests: Recent Labs  Lab 03/03/18 1722  AST 19  ALT 17  ALKPHOS 87  BILITOT 0.8  PROT 6.8  ALBUMIN 3.8   Recent Labs  Lab 03/03/18 1722  LIPASE 21   No results for input(s): AMMONIA in the last 168 hours. Coagulation Profile: No results for input(s): INR, PROTIME in the last 168 hours. Cardiac Enzymes: No results for input(s): CKTOTAL, CKMB, CKMBINDEX, TROPONINI in the last 168 hours. BNP (last 3 results) No results for input(s): PROBNP in the last 8760 hours. HbA1C: No results for input(s): HGBA1C in the last 72 hours. CBG: No results for input(s): GLUCAP in the last 168 hours. Lipid Profile: No results for input(s): CHOL, HDL, LDLCALC, TRIG, CHOLHDL, LDLDIRECT in the last 72 hours. Thyroid Function Tests: No results for input(s): TSH, T4TOTAL, FREET4, T3FREE, THYROIDAB in the last 72 hours. Anemia Panel: No results for input(s): VITAMINB12, FOLATE, FERRITIN, TIBC, IRON, RETICCTPCT in the last 72 hours. Urine analysis:    Component Value Date/Time   COLORURINE YELLOW 03/03/2018 [place] 03/03/2018 1747   LABSPEC 1.020 03/03/2018 1747   PHURINE 5.0 03/03/2018 1747   GLUCOSEU NEGATIVE 03/03/2018 1747   HGBUR NEGATIVE 03/03/2018 1747   BILIRUBINUR NEGATIVE 03/03/2018 1747   KETONESUR NEGATIVE 03/03/2018 1747   PROTEINUR NEGATIVE 03/03/2018 1747   NITRITE NEGATIVE 03/03/2018 1747   LEUKOCYTESUR NEGATIVE 03/03/2018 1747   Sepsis Labs: @LABRCNTIP (procalcitonin:4,lacticidven:4) )No results found for this or any previous visit (from the past 240 hour(s)).   Radiological Exams on Admission: Ct Angio Head W Or Wo Contrast  Result Date: 03/03/2018 CLINICAL DATA:  Acute onset headache EXAM: CT ANGIOGRAPHY HEAD AND NECK TECHNIQUE: Multidetector CT imaging of the head and neck was performed using the standard protocol during bolus administration of intravenous contrast. Multiplanar CT image  reconstructions and MIPs were obtained to evaluate the vascular anatomy. Carotid stenosis measurements (when applicable) are obtained utilizing NASCET criteria, using the distal internal carotid diameter as the denominator. CONTRAST:  79mL ISOVUE-370 IOPAMIDOL (ISOVUE-370) INJECTION 76% COMPARISON:  None. FINDINGS: CT HEAD FINDINGS Brain: There is no mass, hemorrhage or extra-axial collection. The size and configuration of the ventricles and extra-axial CSF spaces are normal. There is no acute or chronic infarction. The brain parenchyma is normal. Skull: The visualized skull base, calvarium and extracranial soft tissues are normal. Sinuses/Orbits: No fluid levels or advanced mucosal thickening of the visualized paranasal sinuses. No mastoid or middle ear effusion. The orbits are normal. CTA NECK FINDINGS SKELETON: There is no bony spinal canal stenosis. No lytic or blastic lesion. OTHER NECK: Normal pharynx, larynx and major salivary glands. No cervical lymphadenopathy. Unremarkable thyroid gland. UPPER CHEST: No pneumothorax or pleural effusion. No nodules or masses. AORTIC ARCH: There is no calcific atherosclerosis of the aortic arch. There is no aneurysm, dissection or hemodynamically significant stenosis of the visualized ascending aorta and aortic arch. Conventional 3 vessel aortic branching pattern. The visualized proximal subclavian arteries are widely patent. RIGHT CAROTID SYSTEM: --Common  carotid artery: Widely patent origin without common carotid artery dissection or aneurysm. --Internal carotid artery: Normal without aneurysm, dissection or stenosis. --External carotid artery: No acute abnormality. LEFT CAROTID SYSTEM: --Common carotid artery: Widely patent origin without common carotid artery dissection or aneurysm. --Internal carotid artery: Normal without aneurysm, dissection or stenosis. --External carotid artery: No acute abnormality. VERTEBRAL ARTERIES: Left dominant configuration. Both origins are  normal. No dissection, occlusion or flow-limiting stenosis to the vertebrobasilar confluence. CTA HEAD FINDINGS POSTERIOR CIRCULATION: --Basilar artery: Normal. --Posterior cerebral arteries: Normal. Both originate from the basilar artery. --Superior cerebellar arteries: Normal. --Inferior cerebellar arteries: Normal anterior and posterior inferior cerebellar arteries. ANTERIOR CIRCULATION: --Intracranial internal carotid arteries: Normal. --Anterior cerebral arteries: Normal. Both A1 segments are present. Patent anterior communicating artery. --Middle cerebral arteries: Normal. --Posterior communicating arteries: Absent bilaterally. VENOUS SINUSES: As permitted by contrast timing, patent. ANATOMIC VARIANTS: None DELAYED PHASE: No parenchymal contrast enhancement. Review of the MIP images confirms the above findings. IMPRESSION: Normal CTA of the head and neck. Electronically Signed   By: Ulyses Jarred M.D.   On: 03/03/2018 21:01   Dg Chest 2 View  Result Date: 03/03/2018 CLINICAL DATA:  Fever EXAM: CHEST - 2 VIEW COMPARISON:  03/03/2018 FINDINGS: Normal heart size. Lungs clear. No pneumothorax. No pleural effusion. IMPRESSION: No active cardiopulmonary disease. Electronically Signed   By: Marybelle Killings M.D.   On: 03/03/2018 20:47   Ct Angio Neck W And/or Wo Contrast  Result Date: 03/03/2018 CLINICAL DATA:  Acute onset headache EXAM: CT ANGIOGRAPHY HEAD AND NECK TECHNIQUE: Multidetector CT imaging of the head and neck was performed using the standard protocol during bolus administration of intravenous contrast. Multiplanar CT image reconstructions and MIPs were obtained to evaluate the vascular anatomy. Carotid stenosis measurements (when applicable) are obtained utilizing NASCET criteria, using the distal internal carotid diameter as the denominator. CONTRAST:  33mL ISOVUE-370 IOPAMIDOL (ISOVUE-370) INJECTION 76% COMPARISON:  None. FINDINGS: CT HEAD FINDINGS Brain: There is no mass, hemorrhage or extra-axial  collection. The size and configuration of the ventricles and extra-axial CSF spaces are normal. There is no acute or chronic infarction. The brain parenchyma is normal. Skull: The visualized skull base, calvarium and extracranial soft tissues are normal. Sinuses/Orbits: No fluid levels or advanced mucosal thickening of the visualized paranasal sinuses. No mastoid or middle ear effusion. The orbits are normal. CTA NECK FINDINGS SKELETON: There is no bony spinal canal stenosis. No lytic or blastic lesion. OTHER NECK: Normal pharynx, larynx and major salivary glands. No cervical lymphadenopathy. Unremarkable thyroid gland. UPPER CHEST: No pneumothorax or pleural effusion. No nodules or masses. AORTIC ARCH: There is no calcific atherosclerosis of the aortic arch. There is no aneurysm, dissection or hemodynamically significant stenosis of the visualized ascending aorta and aortic arch. Conventional 3 vessel aortic branching pattern. The visualized proximal subclavian arteries are widely patent. RIGHT CAROTID SYSTEM: --Common carotid artery: Widely patent origin without common carotid artery dissection or aneurysm. --Internal carotid artery: Normal without aneurysm, dissection or stenosis. --External carotid artery: No acute abnormality. LEFT CAROTID SYSTEM: --Common carotid artery: Widely patent origin without common carotid artery dissection or aneurysm. --Internal carotid artery: Normal without aneurysm, dissection or stenosis. --External carotid artery: No acute abnormality. VERTEBRAL ARTERIES: Left dominant configuration. Both origins are normal. No dissection, occlusion or flow-limiting stenosis to the vertebrobasilar confluence. CTA HEAD FINDINGS POSTERIOR CIRCULATION: --Basilar artery: Normal. --Posterior cerebral arteries: Normal. Both originate from the basilar artery. --Superior cerebellar arteries: Normal. --Inferior cerebellar arteries: Normal anterior and posterior inferior cerebellar arteries. ANTERIOR  CIRCULATION: --Intracranial internal carotid arteries: Normal. --Anterior cerebral arteries: Normal. Both A1 segments are present. Patent anterior communicating artery. --Middle cerebral arteries: Normal. --Posterior communicating arteries: Absent bilaterally. VENOUS SINUSES: As permitted by contrast timing, patent. ANATOMIC VARIANTS: None DELAYED PHASE: No parenchymal contrast enhancement. Review of the MIP images confirms the above findings. IMPRESSION: Normal CTA of the head and neck. Electronically Signed   By: Ulyses Jarred M.D.   On: 03/03/2018 21:01    EKG: Independently reviewed. Normal sinus rhythm.   Assessment/Plan   1. Meningitis  - Presents with 4 days fevers, aches, N/V, and mild upper respiratory sxs followed by onset of headache and neck stiffness early this am  - She is afebrile on arrival with leukocytosis and normal lactate  - Blood cultures collected and influenza PCR ordered but not yet collected  - There is concern for potential meningitis, ED physician did not attempt LP due to pt's hx of lumbar spine fusion  - She was treated with Decadron and started on vancomycin and Rocephin 2 g IV q12h  - IR consulted for LP, continue Decadron and current antibiotics while CSF studies pending, continue precautions and supportive care   2. Hypertension  - BP elevated in ED with pain and nausea likely contributing  - She has documented hx of HTN, but not currently on any antihypertensives  - Continue pain-control and as-needed antiemetics, and use hydralazine IVP's as needed     DVT prophylaxis: SCD's  Code Status: Full  Family Communication: Discussed with patient  Consults called: None  Admission status: observation     Vianne Bulls, MD [facility] Pager [phone]  If 7PM-7AM, please contact night-coverage [email or web address] Password [facility]  03/03/2018, 10:54 PM

## 2018-03-03 NOTE — ED Triage Notes (Signed)
Pt states she developed a fever of 103 on Monday. Pt states she had bodyaches. Pt states she has a headache and neck soreness that developed last night. PT states her fever is only at [place] when she takes tylenol/motrin. Pt went to MD today and had labs drawn. Flu neg, white blood count was high. Sent pt here for the abnormal labs

## 2018-03-03 NOTE — ED Provider Notes (Signed)
[facility] Provider Note   CSN: [id number] Arrival date & time: 03/03/18  1421    History   Chief Complaint No chief complaint on file.   HPI Shannon Vang is a 33 y.o. female.     The history is provided by the patient and medical records (paperwork from PCP). No language interpreter was used.  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 Hypertension     There are no active problems to display for this patient.   History reviewed. No pertinent surgical history.   OB History   No obstetric history on file.      Home Medications    Prior to Admission medications   Medication Sig Start Date End Date Taking? Authorizing Provider  cefdinir (OMNICEF) 300 MG capsule Take 2 capsules (600 mg total) by mouth daily. 02/01/18   Robyn Haber, MD  oxyCODONE-acetaminophen (PERCOCET/ROXICET) 5-325 MG tablet Take 1 tablet by mouth every 6 (six) hours as needed for severe pain. 02/01/18   Robyn Haber, MD    Family History History reviewed. No pertinent family history.  Social History Social History   Tobacco Use  . Smoking status: Current Every Day Smoker  . Smokeless tobacco: Never Used  Substance Use Topics  . Alcohol use: Yes  . Drug use: Not on file     Allergies   Penicillins   Review of Systems Review of Systems  Constitutional: Positive for chills, fatigue and fever. Negative for diaphoresis.    HENT: Positive for congestion. Negative for ear pain.   Eyes: Negative for photophobia and visual disturbance.  Respiratory: Positive for cough. Negative for chest tightness, shortness of breath, wheezing and stridor.   Cardiovascular: Negative for chest pain.  Gastrointestinal: Positive for nausea. Negative for abdominal pain, constipation, diarrhea and vomiting.  Genitourinary: Negative for dysuria and flank pain.  Musculoskeletal: Positive for myalgias, neck pain and neck stiffness. Negative for back pain.  Skin: Negative for rash and wound.  Neurological: Positive for headaches. Negative for dizziness, syncope, speech difficulty, weakness, light-headedness and numbness.  Psychiatric/Behavioral: Negative for agitation and confusion.  All other systems reviewed and are negative.    Physical Exam Updated Vital Signs BP (!) 124/105 (BP Location: Right Arm)   Pulse (!) 116   Temp 98 F (36.7 C) (Oral)   Resp 18   Ht 5\' 6"  (1.676 m)   Wt 95.3 kg   SpO2 97%   BMI 33.89 kg/m   Physical Exam Vitals signs and nursing note reviewed.  Constitutional:      General: She is not in acute distress.    Appearance: She is well-developed. She is not ill-appearing, toxic-appearing or diaphoretic.  HENT:     Head: Normocephalic and atraumatic.     Nose: No congestion or rhinorrhea.     Mouth/Throat:     Mouth: Mucous membranes are moist.     Pharynx: No oropharyngeal exudate or posterior oropharyngeal erythema.  Eyes:  Extraocular Movements: Extraocular movements intact.     Conjunctiva/sclera: Conjunctivae normal.     Pupils: Pupils are equal, round, and reactive to light.  Neck:     Musculoskeletal: Decreased range of motion. Neck rigidity, pain with movement and muscular tenderness (R>L) present. No injury or spinous process tenderness.   Cardiovascular:     Rate and Rhythm: Regular rhythm. Tachycardia present.     Heart sounds: No murmur.  Pulmonary:     Effort: Pulmonary  effort is normal. No respiratory distress.     Breath sounds: Normal breath sounds. No wheezing, rhonchi or rales.  Chest:     Chest wall: No tenderness.  Abdominal:     Palpations: Abdomen is soft.     Tenderness: There is no abdominal tenderness.  Musculoskeletal:        General: Tenderness present.     Right lower leg: No edema.     Left lower leg: No edema.  Skin:    General: Skin is warm and dry.     Capillary Refill: Capillary refill takes less than 2 seconds.  Neurological:     General: No focal deficit present.     Mental Status: She is alert and oriented to person, place, and time. Mental status is at baseline.     GCS: GCS eye subscore is 4. GCS verbal subscore is 5. GCS motor subscore is 6.     Cranial Nerves: Cranial nerves are intact. No dysarthria.     Sensory: Sensation is intact. No sensory deficit.     Motor: Motor function is intact. No weakness, tremor, abnormal muscle tone or seizure activity.     Coordination: Coordination normal. Finger-Nose-Finger Test normal.     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  No change from her baseline by report.    Psychiatric:        Mood and Affect: Mood normal.      ED Treatments / Results  Labs (all labs ordered are listed, but only abnormal results are displayed) Labs Reviewed  CBC WITH DIFFERENTIAL/PLATELET - Abnormal; Notable for the following components:      Result Value   WBC 18.5 (*)    Platelets 449 (*)    Neutro Abs 13.8 (*)    Abs Immature Granulocytes 0.08 (*)    All other components within normal limits  COMPREHENSIVE METABOLIC PANEL - Abnormal; Notable for the following components:   Glucose, Bld 104 (*)    All other components within normal limits  CULTURE, BLOOD (ROUTINE X 2)  CULTURE, BLOOD (ROUTINE X 2)  URINE CULTURE  LIPASE, BLOOD  URINALYSIS, ROUTINE W REFLEX MICROSCOPIC  LACTIC ACID, PLASMA  PREGNANCY, URINE  INFLUENZA PANEL BY PCR (TYPE A & B)     EKG EKG Interpretation  Date/Time:  Thursday March 03 2018 18:00:39 EST Ventricular Rate:  95 PR Interval:  156 QRS Duration: 90 QT Interval:  368 QTC Calculation: 462 R Axis:   78 Text Interpretation:  Normal sinus rhythm Normal ECG No prior ECG for comparison.  No STEMI Confirmed by Antony Blackbird [phone]) on 03/03/2018 6:20:23 PM   Radiology No results found.  Procedures Procedures (including critical care time)  Medications Ordered in ED Medications  sodium chloride 0.9 % bolus 1,000 mL (1,000 mLs Intravenous New Bag/Given 03/03/18 1803)  prochlorperazine (COMPAZINE) injection 10 mg (10 mg Intravenous Given 03/03/18 1803)  diphenhydrAMINE (BENADRYL) injection 25 mg (25 mg Intravenous Given 03/03/18 1803)     Initial Impression /  Assessment and Plan / ED Course  I have reviewed the triage vital signs and the nursing notes.  Pertinent labs & imaging results that were available during my care of the patient were reviewed by me and considered in my medical decision making (see chart for details).        Shannon Vang is a 33 y.o. female with a past medical history significant for hypertension who presents from her PCP for further evaluation management of infection.  Patient reports that for the last 4 days she is been having intermittent fevers.  She reports is had fatigue and myalgias with body aches.  She reports that yesterday she started having headache and neck soreness.  She reports some intermittent cough and some congestion.  She denies urinary symptoms constipation, or diarrhea.  She has some nausea but no vomiting.  She has no chest pain or palpitations.  No recent trauma.  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  She went to her PCP today who found her to have negative flu testing a verbally reported -1 view chest and negative urinalysis.  Her white count was elevated in the 20s and patient was sent here to  rule out meningitis and further work-up.  Patient reports her headache is a 5 and 10 severity currently.  She has neck stiffness and pain with flexion.  She denies any history of this type of headache.  She denies chest pain or shortness of breath.  Lungs are clear and chest is nontender.  No focal neurologic deficits on my initial exam.  No neck tenderness.  Pupils are symmetric and reactive.  Normal extraocular movements.  Patient has no significant pain with neck rotation but has the stiffness and pain with flexion.  Normal strength and sensation all extremities.  Normal pulses in extremities.  No murmur.  Abdomen nontender.  Had a shared decision made conversation with patient.  We discussed different options of management at this time.  We decided initially to repeat the blood work from her PCP as well as a two-view chest x-ray and repeat urinalysis.  We also decided to try a headache cocktail and rehydration to help with the headache, neck pain, and tachycardia.  Patient wanted to try this for period of time before jumping straight to lumbar puncture antibiotics and admission.  Patient wants to wait on antibiotics LP and admission until the headache cocktail and any imaging is done first.  This was felt to be reasonable given her otherwise well appearance.  We will try the headache cocktail and fluids initially however if symptoms do not improve and she still has the neck stiffness and if her lab values are confirmed, she will likely need antibiotics and lumbar puncture.  Patient reports that she had multiple back surgeries and has hardware in her entire L-spine.  She says that during previous epidural for childbirth 3 anesthesiologist had to try before on was able to successfully LP her.  She will likely need to monitor IR guidance.  6:27 PM Just reassessed patient and she was to having significant pain more on the right side of her neck than left.  She reports that she did have onset of pain when  she was twisting her neck.  Due to this, patient will have CTA of her head and neck to look for dissection as a cause of her headache and neck pain.  May have happened during a twist or cough.  Patient still having symptoms.  If CTA head and neck do not show abnormality causing her head and neck pain, patient will need to be admitted for antibiotics and likely IR guided LP.  Patient is on airborne precautions and thus is unable to go get AP and lateral chest x-ray.  As patient reports he had chest x-ray that was normal earlier today on 1 view, she does not want to get a repeat portable 1 view again as this does not add new imaging to her work-up.  This was felt to be a reasonable plan given her lack of abnormalities on auscultation.   Neurology will likely need to be called for involvement as well if CTA shows dissection or other abnormality.  Nursing reports that they have to clean the CT area before she gets the imaging.  They are going to do this.  6:59 PM Patient reports her headache has slightly improved but she still being severe right-sided neck and occipital pain.  After imaging patient will likely require antibiotics and admission.    Final Clinical Impressions(s) / ED Diagnoses   Final diagnoses:  Fever, unspecified fever cause  Neck pain  Neck stiffness  Leukocytosis, unspecified type     Clinical Impression: 1. Fever, unspecified fever cause   2. Neck pain   3. Neck stiffness   4. Leukocytosis, unspecified type     Disposition: Care transferred to [facility] while awaiting for CT results.  Anticipate admission if imaging is negative and patient still feels bad for LP antibiotics and monitoring.  This note was prepared with assistance of Systems analyst. Occasional wrong-word or sound-a-like substitutions may have occurred due to the inherent limitations of voice recognition software.      , Gwenyth Allegra, MD 03/03/18 [phone]

## 2018-03-03 NOTE — ED Provider Notes (Signed)
Signout from previous provider, Dr. Sherry Ruffing, at shift change See previous providers note for full H&P  Briefly, patient is presenting with a 4-day history of fever, body aches, neck pain and stiffness.  Influenza was negative.  Work-up at PCP showed leukocytosis at 24,000.  Her neck pain started when she coughs and so there is concern for dissection versus meningitis. CT angio pending. If negative and still pain, admit to medicine to admit for LP with IR and +/- abx.   CT angio head and neck returned negative.  I discussed patient case with Dr. Myna Hidalgo with [facility] who accepts patient for admission.  He will arrange LP with interventional radiology.  Decadron and antibiotics ordered for suspected meningitis.  I appreciate his assistance with the patient.   Frederica Kuster, PA-C 03/03/18 2212    Tegeler, Gwenyth Allegra, MD 03/04/18 [phone]

## 2018-03-03 NOTE — Progress Notes (Signed)
Pharmacy Antibiotic Note  Shannon Vang is a 33 y.o. female admitted on 03/03/2018 with meningitis.  Pharmacy has been consulted for vancomycin dosing.  Presenting with chills, fatigue, cough, myalgias, neck pain/stiffness, and headache. Normal CTA of head/neck. WBC 18.5, LA 1.9, afebrile. Scr 0.81 (CrCl>100 mL/min). Receiving ceftriaxone 2 g in ED.  Plan: Continue ceftriaxone 2g IV every 12 hours Start vancomycin 2g IV once then 1 g IV every 8 hours Monitor renal fx, cx results, clinical pic, and levels as appropriate  Height: 5\' 6"  (167.6 cm) Weight: 210 lb (95.3 kg) IBW/kg (Calculated) : 59.3  Temp (24hrs), Avg:98.3 F (36.8 C), Min:98 F (36.7 C), Max:98.5 F (36.9 C)  Recent Labs  Lab 03/03/18 1722  WBC 18.5*  CREATININE 0.81  LATICACIDVEN 1.9    Estimated Creatinine Clearance: 114.9 mL/min (by C-G formula based on SCr of 0.81 mg/dL).    Allergies  Allergen Reactions  . Penicillins     Antimicrobials this admission: Vancomycin 2/27 >>  Ceftriaxone 2/27 >>   Dose adjustments this admission: N/A  Microbiology results: 2/27 BCx: sent 2/27 UCx: sent   Thank you for allowing pharmacy to be a part of this patient's care.  Antonietta Jewel, PharmD, [place] Clinical Pharmacist  Pager: [phone] Phone: [phone] 03/03/2018 9:18 PM

## 2018-03-04 DIAGNOSIS — R7989 Other specified abnormal findings of blood chemistry: Secondary | ICD-10-CM | POA: Diagnosis not present

## 2018-03-04 DIAGNOSIS — G43909 Migraine, unspecified, not intractable, without status migrainosus: Secondary | ICD-10-CM | POA: Diagnosis not present

## 2018-03-04 DIAGNOSIS — R000 Tachycardia, unspecified: Secondary | ICD-10-CM | POA: Diagnosis not present

## 2018-03-04 DIAGNOSIS — I4891 Unspecified atrial fibrillation: Secondary | ICD-10-CM | POA: Diagnosis not present

## 2018-03-04 DIAGNOSIS — Z79899 Other long term (current) drug therapy: Secondary | ICD-10-CM | POA: Diagnosis not present

## 2018-03-04 DIAGNOSIS — G43809 Other migraine, not intractable, without status migrainosus: Secondary | ICD-10-CM | POA: Diagnosis not present

## 2018-03-04 DIAGNOSIS — Z8673 Personal history of transient ischemic attack (TIA), and cerebral infarction without residual deficits: Secondary | ICD-10-CM | POA: Diagnosis not present

## 2018-03-04 DIAGNOSIS — I10 Essential (primary) hypertension: Secondary | ICD-10-CM | POA: Diagnosis not present

## 2018-03-04 DIAGNOSIS — D72829 Elevated white blood cell count, unspecified: Secondary | ICD-10-CM | POA: Diagnosis not present

## 2018-03-04 DIAGNOSIS — F17200 Nicotine dependence, unspecified, uncomplicated: Secondary | ICD-10-CM | POA: Diagnosis not present

## 2018-03-04 LAB — GRAM STAIN

## 2018-03-04 LAB — CBC WITH DIFFERENTIAL/PLATELET
Abs Immature Granulocytes: 0.03 10*3/uL (ref 0.00–0.07)
Basophils Absolute: 0 10*3/uL (ref 0.0–0.1)
Basophils Relative: 0 %
Eosinophils Absolute: 0 10*3/uL (ref 0.0–0.5)
Eosinophils Relative: 0 %
HCT: 38.7 % (ref 36.0–46.0)
Hemoglobin: 12.2 g/dL (ref 12.0–15.0)
Immature Granulocytes: 1 %
Lymphocytes Relative: 15 %
Lymphs Abs: 1 10*3/uL (ref 0.7–4.0)
MCH: 26.6 pg (ref 26.0–34.0)
MCHC: 31.5 g/dL (ref 30.0–36.0)
MCV: 84.5 fL (ref 80.0–100.0)
MONO ABS: 0.1 10*3/uL (ref 0.1–1.0)
Monocytes Relative: 1 %
Neutro Abs: 5.2 10*3/uL (ref 1.7–7.7)
Neutrophils Relative %: 83 %
Platelets: 381 10*3/uL (ref 150–400)
RBC: 4.58 MIL/uL (ref 3.87–5.11)
RDW: 13.4 % (ref 11.5–15.5)
WBC: 6.3 10*3/uL (ref 4.0–10.5)
nRBC: 0 % (ref 0.0–0.2)

## 2018-03-04 LAB — URINE CULTURE: Culture: NO GROWTH

## 2018-03-04 LAB — BASIC METABOLIC PANEL
Anion gap: 8 (ref 5–15)
BUN: 12 mg/dL (ref 6–20)
CO2: 23 mmol/L (ref 22–32)
Calcium: 9 mg/dL (ref 8.9–10.3)
Chloride: 106 mmol/L (ref 98–111)
Creatinine, Ser: 0.96 mg/dL (ref 0.44–1.00)
GFR calc Af Amer: >60 mL/min (ref >60)
Glucose, Bld: 131 mg/dL — ABNORMAL HIGH (ref 70–99)
Potassium: 4.1 mmol/L (ref 3.5–5.1)
SODIUM: 137 mmol/L (ref 135–145)

## 2018-03-04 LAB — INFLUENZA PANEL BY PCR (TYPE A & B)
Influenza A By PCR: NEGATIVE
Influenza B By PCR: NEGATIVE

## 2018-03-04 LAB — CSF CELL COUNT WITH DIFFERENTIAL
RBC Count, CSF: 0 /mm3
Tube #: 3
WBC, CSF: 3 /mm3 (ref 0–5)

## 2018-03-04 LAB — HIV ANTIBODY (ROUTINE TESTING W REFLEX): HIV SCREEN 4TH GENERATION: NONREACTIVE

## 2018-03-04 LAB — GLUCOSE, CSF: Glucose, CSF: 68 mg/dL (ref 40–70)

## 2018-03-04 LAB — PROTEIN, CSF: Total  Protein, CSF: 21 mg/dL (ref 15–45)

## 2018-03-04 MED ORDER — IBUPROFEN 200 MG PO TABS
600.0000 mg | ORAL_TABLET | Freq: Once | ORAL | Status: AC
  Administered 2018-03-04: 600 mg via ORAL
  Filled 2018-03-04: qty 3

## 2018-03-04 NOTE — Discharge Summary (Signed)
Physician Discharge Summary  Shannon Vang IRJ:[id number] DOB: 09-10-1985 DOA: 03/03/2018  PCP: Dineen Kid, MD  Admit date: 03/03/2018 Discharge date: 03/04/2018  Admitted From: Home  Disposition:  Home   Recommendations for Outpatient Follow-up:  1. Dr. Via: please review patient's history for Afib and peri-partum stroke; she had no documented Afib in the hospital, but reported it to me at discharge; if she has had stroke and has paroxysmal Afib, would recommend anticoagulation 2. Please repeat BP in 4-6 weeks     Home Health: None  Equipment/Devices: None  Discharge Condition: Good  CODE STATUS: FULL Diet recommendation: Regular  Brief/Interim Summary: Mrs. Shannon Vang is a 33 y.o. F who presents with 2-3 days progressing URI symptoms (fever 103F, chills, myalgias, fatigue, sore throat, vomiting) and then woke up day of admission with severe neck stiffness and HA.  Went to PCP and was sent to ER.  In ER, afebrile, tachycardic, hypertensive.  CTA head and neck normal.  CXR clear.  LP deferred by EDP.  Started on empiric vancomycin, Rocephin and Decadron.       PRINCIPAL HOSPITAL DIAGNOSIS: Viral syndrome    Discharge Diagnoses:  Migraine Admitted on empiric antibiotics vanc/CTX.  LP obtained overnight, showed no RBC, 3 WBC only, normal glucose and protein, no bacteria on GS.  WBC normalized completely overnight and patient asymptomatic in morning, felt like her normal self.    CTA neck negative for dissection or deep absces.  Oral exam normal, no oral complaints, doubt abscess related to recent dental extraction.  She did have a 10 day course cefdinir a few weeks ago, but her CSF studies and course are NOT consistent with partially treated meningitis.  Have to assume this was a migraine headache provoked by myalgias from a viral syndrome.   Purported atrial fibrillation Patient reported to me she was told in the ER hse was in Afib.  Her ECG was personally reviewed by me, was  sinus.  No notes from EDP or admitted MD document Afib.  No previous notes in chart indicate she has history of Afib.  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  If true, this would suggest a CHA2DS2-Vasc 4, and I would recommend anticoagulation.  Given I cannot verify this history, this is deferred to PCP.   -Please review.  Hypertension Diastolic BP elevated.  Follow up with PCP            Discharge Instructions  Discharge Instructions    Diet general   Complete by:  As directed    Discharge instructions   Complete by:  As directed    From Dr. Loleta Books: You were admitted with headache, and there was concern that you might have meningitis.  Thankfully, here your lumbar puncture and spinal fluid studies were totally normal, without any signs of infection (normal protein and glucose in the spinal fluid, no bacteria or abnormal cells).   All in all, this spinal fluid testing is inconsistent with any form of meningitis, and so no antibiotics are needed or helpful.  I did not appreciate any problems with your recently removed teeth either, and if there were an infection deeper from those teeth (like an abscess in your neck) this would have appeared on the CT scan of your neck that we did, but it did not (this scan was completely normal).    I suspect you have a respiratory virus (ie "the common cold").    Drink plenty of fluids Take ibuprofen for pain Practice good hygiene and  keep your distance from close contacts until you are symptom free for 48 hours.   Regarding Afib: We saw nothing here to suggest you have Afib.   If you have had a stroke, and you have Afib, you should be on a blood thinner. Please review this with your primary care doctor and your cardiologist to discuss whether it is apporpriate for you to be on a blood thinner.   Increase activity slowly   Complete by:  As directed      Allergies as of 03/04/2018      Reactions   Penicillins Rash       Medication List    You have not been prescribed any medications.     Allergies  Allergen Reactions  . Penicillins Rash    Consultations:  None   Procedures/Studies: Ct Angio Head W Or Wo Contrast  Result Date: 03/03/2018 CLINICAL DATA:  Acute onset headache EXAM: CT ANGIOGRAPHY HEAD AND NECK TECHNIQUE: Multidetector CT imaging of the head and neck was performed using the standard protocol during bolus administration of intravenous contrast. Multiplanar CT image reconstructions and MIPs were obtained to evaluate the vascular anatomy. Carotid stenosis measurements (when applicable) are obtained utilizing NASCET criteria, using the distal internal carotid diameter as the denominator. CONTRAST:  84mL ISOVUE-370 IOPAMIDOL (ISOVUE-370) INJECTION 76% COMPARISON:  None. FINDINGS: CT HEAD FINDINGS Brain: There is no mass, hemorrhage or extra-axial collection. The size and configuration of the ventricles and extra-axial CSF spaces are normal. There is no acute or chronic infarction. The brain parenchyma is normal. Skull: The visualized skull base, calvarium and extracranial soft tissues are normal. Sinuses/Orbits: No fluid levels or advanced mucosal thickening of the visualized paranasal sinuses. No mastoid or middle ear effusion. The orbits are normal. CTA NECK FINDINGS SKELETON: There is no bony spinal canal stenosis. No lytic or blastic lesion. OTHER NECK: Normal pharynx, larynx and major salivary glands. No cervical lymphadenopathy. Unremarkable thyroid gland. UPPER CHEST: No pneumothorax or pleural effusion. No nodules or masses. AORTIC ARCH: There is no calcific atherosclerosis of the aortic arch. There is no aneurysm, dissection or hemodynamically significant stenosis of the visualized ascending aorta and aortic arch. Conventional 3 vessel aortic branching pattern. The visualized proximal subclavian arteries are widely patent. RIGHT CAROTID SYSTEM: --Common carotid artery: Widely patent origin  without common carotid artery dissection or aneurysm. --Internal carotid artery: Normal without aneurysm, dissection or stenosis. --External carotid artery: No acute abnormality. LEFT CAROTID SYSTEM: --Common carotid artery: Widely patent origin without common carotid artery dissection or aneurysm. --Internal carotid artery: Normal without aneurysm, dissection or stenosis. --External carotid artery: No acute abnormality. VERTEBRAL ARTERIES: Left dominant configuration. Both origins are normal. No dissection, occlusion or flow-limiting stenosis to the vertebrobasilar confluence. CTA HEAD FINDINGS POSTERIOR CIRCULATION: --Basilar artery: Normal. --Posterior cerebral arteries: Normal. Both originate from the basilar artery. --Superior cerebellar arteries: Normal. --Inferior cerebellar arteries: Normal anterior and posterior inferior cerebellar arteries. ANTERIOR CIRCULATION: --Intracranial internal carotid arteries: Normal. --Anterior cerebral arteries: Normal. Both A1 segments are present. Patent anterior communicating artery. --Middle cerebral arteries: Normal. --Posterior communicating arteries: Absent bilaterally. VENOUS SINUSES: As permitted by contrast timing, patent. ANATOMIC VARIANTS: None DELAYED PHASE: No parenchymal contrast enhancement. Review of the MIP images confirms the above findings. IMPRESSION: Normal CTA of the head and neck. Electronically Signed   By: Ulyses Jarred M.D.   On: 03/03/2018 21:01   Dg Chest 2 View  Result Date: 03/03/2018 CLINICAL DATA:  Fever EXAM: CHEST - 2 VIEW COMPARISON:  03/03/2018 FINDINGS: Normal  heart size. Lungs clear. No pneumothorax. No pleural effusion. IMPRESSION: No active cardiopulmonary disease. Electronically Signed   By: Marybelle Killings M.D.   On: 03/03/2018 20:47   Ct Angio Neck W And/or Wo Contrast  Result Date: 03/03/2018 CLINICAL DATA:  Acute onset headache EXAM: CT ANGIOGRAPHY HEAD AND NECK TECHNIQUE: Multidetector CT imaging of the head and neck was  performed using the standard protocol during bolus administration of intravenous contrast. Multiplanar CT image reconstructions and MIPs were obtained to evaluate the vascular anatomy. Carotid stenosis measurements (when applicable) are obtained utilizing NASCET criteria, using the distal internal carotid diameter as the denominator. CONTRAST:  26mL ISOVUE-370 IOPAMIDOL (ISOVUE-370) INJECTION 76% COMPARISON:  None. FINDINGS: CT HEAD FINDINGS Brain: There is no mass, hemorrhage or extra-axial collection. The size and configuration of the ventricles and extra-axial CSF spaces are normal. There is no acute or chronic infarction. The brain parenchyma is normal. Skull: The visualized skull base, calvarium and extracranial soft tissues are normal. Sinuses/Orbits: No fluid levels or advanced mucosal thickening of the visualized paranasal sinuses. No mastoid or middle ear effusion. The orbits are normal. CTA NECK FINDINGS SKELETON: There is no bony spinal canal stenosis. No lytic or blastic lesion. OTHER NECK: Normal pharynx, larynx and major salivary glands. No cervical lymphadenopathy. Unremarkable thyroid gland. UPPER CHEST: No pneumothorax or pleural effusion. No nodules or masses. AORTIC ARCH: There is no calcific atherosclerosis of the aortic arch. There is no aneurysm, dissection or hemodynamically significant stenosis of the visualized ascending aorta and aortic arch. Conventional 3 vessel aortic branching pattern. The visualized proximal subclavian arteries are widely patent. RIGHT CAROTID SYSTEM: --Common carotid artery: Widely patent origin without common carotid artery dissection or aneurysm. --Internal carotid artery: Normal without aneurysm, dissection or stenosis. --External carotid artery: No acute abnormality. LEFT CAROTID SYSTEM: --Common carotid artery: Widely patent origin without common carotid artery dissection or aneurysm. --Internal carotid artery: Normal without aneurysm, dissection or stenosis.  --External carotid artery: No acute abnormality. VERTEBRAL ARTERIES: Left dominant configuration. Both origins are normal. No dissection, occlusion or flow-limiting stenosis to the vertebrobasilar confluence. CTA HEAD FINDINGS POSTERIOR CIRCULATION: --Basilar artery: Normal. --Posterior cerebral arteries: Normal. Both originate from the basilar artery. --Superior cerebellar arteries: Normal. --Inferior cerebellar arteries: Normal anterior and posterior inferior cerebellar arteries. ANTERIOR CIRCULATION: --Intracranial internal carotid arteries: Normal. --Anterior cerebral arteries: Normal. Both A1 segments are present. Patent anterior communicating artery. --Middle cerebral arteries: Normal. --Posterior communicating arteries: Absent bilaterally. VENOUS SINUSES: As permitted by contrast timing, patent. ANATOMIC VARIANTS: None DELAYED PHASE: No parenchymal contrast enhancement. Review of the MIP images confirms the above findings. IMPRESSION: Normal CTA of the head and neck. Electronically Signed   By: Ulyses Jarred M.D.   On: 03/03/2018 21:01   Dg Fluoro Guide Lumbar Puncture  Result Date: 03/03/2018 CLINICAL DATA:  Fever, headache, neck stiffness EXAM: DIAGNOSTIC LUMBAR PUNCTURE UNDER FLUOROSCOPIC GUIDANCE FLUOROSCOPY TIME:  Fluoroscopy Time:  42 seconds Radiation Exposure Index (if provided by the fluoroscopic device): Number of Acquired Spot Images: 0 PROCEDURE: Informed consent was obtained from the patient prior to the procedure, including potential complications of headache, allergy, and pain. With the patient prone, the lower back was prepped with Betadine. 1% Lidocaine was used for local anesthesia. Lumbar puncture was performed at the L3-4 level using a 20 gauge needle with return of clear CSF with an opening pressure of 22 cm water. Ten ml of CSF were obtained for laboratory studies. The patient tolerated the procedure well and there were no apparent complications. IMPRESSION:  Successful lumbar  puncture under fluoroscopic guidance as above. Electronically Signed   By: Rolm Baptise M.D.   On: 03/03/2018 23:34       Subjective: Feeling well.  Mild headache, mild right sided neck pain.  No fever, no oral pain, no cough, sore throat, visual changes.  Discharge Exam: Vitals:   03/04/18 0550 03/04/18 0851  BP: (!) 131/91 (!) 146/95  Pulse: 91 89  Resp:  18  Temp: 97.9 F (36.6 C) 98.1 F (36.7 C)  SpO2: 98% 98%   Vitals:   03/03/18 2352 03/04/18 0138 03/04/18 0550 03/04/18 0851  BP: 130/89  (!) 131/91 (!) 146/95  Pulse: 92  91 89  Resp: 16   18  Temp: 98.4 F (36.9 C) 98.2 F (36.8 C) 97.9 F (36.6 C) 98.1 F (36.7 C)  TempSrc: Oral Oral Oral Oral  SpO2: 98%  98% 98%  Weight:      Height:        General: Pt is alert, awake, not in acute distress Cardiovascular: RRR, nl S1-S2, no murmurs appreciated.   No LE edema.   Respiratory: Normal respiratory rate and rhythm.  CTAB without rales or wheezes. Abdominal: Abdomen soft and non-tender.  No distension or HSM.   Neuro/Psych: Strength symmetric in upper and lower extremities.  Judgment and insight appear normal.   The results of significant diagnostics from this hospitalization (including imaging, microbiology, ancillary and laboratory) are listed below for reference.     Microbiology: Recent Results (from the past 240 hour(s))  Blood culture (routine x 2)     Status: None (Preliminary result)   Collection Time: 03/03/18  5:22 PM  Result Value Ref Range Status   Specimen Description BLOOD RIGHT HAND  Final   Special Requests   Final    BOTTLES DRAWN AEROBIC AND ANAEROBIC Blood Culture adequate volume Performed at [facility], [address], [place], [place] [address]    Culture NO GROWTH < 24 HOURS  Final   Report Status PENDING  Incomplete  Blood culture (routine x 2)     Status: None (Preliminary result)   Collection Time: 03/03/18  5:45 PM  Result Value Ref Range Status   Specimen Description  BLOOD LEFT ANTECUBITAL  Final   Special Requests   Final    BOTTLES DRAWN AEROBIC AND ANAEROBIC Blood Culture results may not be optimal due to an excessive volume of blood received in culture bottles Performed at [place] [address], [place], [place] [address]    Culture NO GROWTH < 24 HOURS  Final   Report Status PENDING  Incomplete  Gram stain     Status: None (Preliminary result)   Collection Time: 03/04/18 12:16 AM  Result Value Ref Range Status   Specimen Description CSF  Final   Special Requests NONE  Final   Gram Stain   Final    CYTOSPIN SLIDE NO WBC SEEN NO ORGANISMS SEEN Performed at [facility], [place] [address], [place], [place] [address]    Report Status PENDING  Incomplete     Labs: BNP (last 3 results) No results for input(s): BNP in the last 8760 hours. Basic Metabolic Panel: Recent Labs  Lab 03/03/18 1722 03/04/18 0343  NA 135 137  K 3.5 4.1  CL 101 106  CO2 24 23  GLUCOSE 104* 131*  BUN 11 12  CREATININE 0.81 0.96  CALCIUM 9.2 9.0   Liver Function Tests: Recent Labs  Lab 03/03/18  1722  AST 19  ALT 17  ALKPHOS 87  BILITOT 0.8  PROT 6.8  ALBUMIN 3.8   Recent Labs  Lab 03/03/18 1722  LIPASE 21   No results for input(s): AMMONIA in the last 168 hours. CBC: Recent Labs  Lab 03/03/18 1722 03/04/18 0343  WBC 18.5* 6.3  NEUTROABS 13.8* 5.2  HGB 13.1 12.2  HCT 40.9 38.7  MCV 83.5 84.5  PLT 449* 381   Cardiac Enzymes: No results for input(s): CKTOTAL, CKMB, CKMBINDEX, TROPONINI in the last 168 hours. BNP: Invalid input(s): POCBNP CBG: No results for input(s): GLUCAP in the last 168 hours. D-Dimer No results for input(s): DDIMER in the last 72 hours. Hgb A1c No results for input(s): HGBA1C in the last 72 hours. Lipid Profile No results for input(s): CHOL, HDL, LDLCALC, TRIG, CHOLHDL, LDLDIRECT in the last 72 hours. Thyroid function studies No results for input(s): TSH, T4TOTAL, T3FREE, THYROIDAB in the last 72  hours.  Invalid input(s): FREET3 Anemia work up No results for input(s): VITAMINB12, FOLATE, FERRITIN, TIBC, IRON, RETICCTPCT in the last 72 hours. Urinalysis    Component Value Date/Time   COLORURINE YELLOW 03/03/2018 1747   APPEARANCEUR CLEAR 03/03/2018 1747   LABSPEC 1.020 03/03/2018 1747   PHURINE 5.0 03/03/2018 1747   GLUCOSEU NEGATIVE 03/03/2018 1747   HGBUR NEGATIVE 03/03/2018 1747   BILIRUBINUR NEGATIVE 03/03/2018 1747   KETONESUR NEGATIVE 03/03/2018 1747   PROTEINUR NEGATIVE 03/03/2018 1747   NITRITE NEGATIVE 03/03/2018 1747   LEUKOCYTESUR NEGATIVE 03/03/2018 1747   Sepsis Labs Invalid input(s): PROCALCITONIN,  WBC,  LACTICIDVEN Microbiology Recent Results (from the past 240 hour(s))  Blood culture (routine x 2)     Status: None (Preliminary result)   Collection Time: 03/03/18  5:22 PM  Result Value Ref Range Status   Specimen Description BLOOD RIGHT HAND  Final   Special Requests   Final    BOTTLES DRAWN AEROBIC AND ANAEROBIC Blood Culture adequate volume Performed at [facility], [place] [address] [place], [place] [address]    Culture NO GROWTH < 24 HOURS  Final   Report Status PENDING  Incomplete  Blood culture (routine x 2)     Status: None (Preliminary result)   Collection Time: 03/03/18  5:45 PM  Result Value Ref Range Status   Specimen Description BLOOD LEFT ANTECUBITAL  Final   Special Requests   Final    BOTTLES DRAWN AEROBIC AND ANAEROBIC Blood Culture results may not be optimal due to an excessive volume of blood received in culture bottles Performed at [place] [address] [place], [place] [address]    Culture NO GROWTH < 24 HOURS  Final   Report Status PENDING  Incomplete  Gram stain     Status: None (Preliminary result)   Collection Time: 03/04/18 12:16 AM  Result Value Ref Range Status   Specimen Description CSF  Final   Special Requests NONE  Final   Gram Stain   Final    CYTOSPIN SLIDE NO WBC SEEN NO ORGANISMS  SEEN Performed at [facility], [place] [address], [place],  [address]    Report Status PENDING  Incomplete     Time coordinating discharge: 25 minutes       SIGNED:   Edwin Dada, MD  [facility] 03/04/2018, 9:48 AM

## 2018-03-06 DIAGNOSIS — F1120 Opioid dependence, uncomplicated: Secondary | ICD-10-CM | POA: Diagnosis not present

## 2018-03-07 LAB — CSF CULTURE

## 2018-03-07 LAB — CSF CULTURE W GRAM STAIN: Culture: NO GROWTH

## 2018-03-08 LAB — CULTURE, BLOOD (ROUTINE X 2)
CULTURE: NO GROWTH
Culture: NO GROWTH
Special Requests: ADEQUATE

## 2018-03-13 DIAGNOSIS — F1120 Opioid dependence, uncomplicated: Secondary | ICD-10-CM | POA: Diagnosis not present

## 2018-03-17 DIAGNOSIS — F1120 Opioid dependence, uncomplicated: Secondary | ICD-10-CM | POA: Diagnosis not present

## 2018-03-20 DIAGNOSIS — F1120 Opioid dependence, uncomplicated: Secondary | ICD-10-CM | POA: Diagnosis not present

## 2018-03-27 DIAGNOSIS — F1120 Opioid dependence, uncomplicated: Secondary | ICD-10-CM | POA: Diagnosis not present

## 2018-03-31 DIAGNOSIS — B349 Viral infection, unspecified: Secondary | ICD-10-CM | POA: Diagnosis not present

## 2018-03-31 DIAGNOSIS — F17210 Nicotine dependence, cigarettes, uncomplicated: Secondary | ICD-10-CM | POA: Diagnosis not present

## 2018-03-31 DIAGNOSIS — Z119 Encounter for screening for infectious and parasitic diseases, unspecified: Secondary | ICD-10-CM | POA: Diagnosis not present

## 2018-03-31 DIAGNOSIS — R0989 Other specified symptoms and signs involving the circulatory and respiratory systems: Secondary | ICD-10-CM | POA: Diagnosis not present

## 2018-03-31 DIAGNOSIS — R05 Cough: Secondary | ICD-10-CM | POA: Diagnosis not present

## 2018-03-31 DIAGNOSIS — Z880 Allergy status to penicillin: Secondary | ICD-10-CM | POA: Diagnosis not present

## 2018-03-31 DIAGNOSIS — R1110 Vomiting, unspecified: Secondary | ICD-10-CM | POA: Diagnosis not present

## 2018-03-31 DIAGNOSIS — R000 Tachycardia, unspecified: Secondary | ICD-10-CM | POA: Diagnosis not present

## 2018-03-31 DIAGNOSIS — E876 Hypokalemia: Secondary | ICD-10-CM | POA: Diagnosis not present

## 2018-04-01 DIAGNOSIS — F1120 Opioid dependence, uncomplicated: Secondary | ICD-10-CM | POA: Diagnosis not present

## 2018-04-03 DIAGNOSIS — F1120 Opioid dependence, uncomplicated: Secondary | ICD-10-CM | POA: Diagnosis not present

## 2018-04-10 DIAGNOSIS — F1120 Opioid dependence, uncomplicated: Secondary | ICD-10-CM | POA: Diagnosis not present

## 2018-04-12 DIAGNOSIS — R0982 Postnasal drip: Secondary | ICD-10-CM | POA: Diagnosis not present

## 2018-04-12 DIAGNOSIS — R0602 Shortness of breath: Secondary | ICD-10-CM | POA: Diagnosis not present

## 2018-04-12 DIAGNOSIS — H6983 Other specified disorders of Eustachian tube, bilateral: Secondary | ICD-10-CM | POA: Diagnosis not present

## 2018-04-17 DIAGNOSIS — F1120 Opioid dependence, uncomplicated: Secondary | ICD-10-CM | POA: Diagnosis not present

## 2018-04-24 ENCOUNTER — Emergency Department (HOSPITAL_COMMUNITY)
Admission: EM | Admit: 2018-04-24 | Discharge: 2018-04-25 | Disposition: A | Discharge status: Home / Self Care | Payer: 59 | Attending: Emergency Medicine | Admitting: Emergency Medicine

## 2018-04-24 ENCOUNTER — Encounter (HOSPITAL_COMMUNITY): Payer: Self-pay | Admitting: Emergency Medicine

## 2018-04-24 ENCOUNTER — Other Ambulatory Visit: Payer: Self-pay

## 2018-04-24 DIAGNOSIS — K0889 Other specified disorders of teeth and supporting structures: Secondary | ICD-10-CM | POA: Diagnosis not present

## 2018-04-24 DIAGNOSIS — I10 Essential (primary) hypertension: Secondary | ICD-10-CM | POA: Diagnosis not present

## 2018-04-24 DIAGNOSIS — F17210 Nicotine dependence, cigarettes, uncomplicated: Secondary | ICD-10-CM | POA: Diagnosis not present

## 2018-04-24 DIAGNOSIS — F1120 Opioid dependence, uncomplicated: Secondary | ICD-10-CM | POA: Diagnosis not present

## 2018-04-24 NOTE — ED Triage Notes (Signed)
Pt c/o dental pain from a broken tooth tonight.

## 2018-04-25 DIAGNOSIS — K0889 Other specified disorders of teeth and supporting structures: Secondary | ICD-10-CM | POA: Diagnosis not present

## 2018-04-25 DIAGNOSIS — F17210 Nicotine dependence, cigarettes, uncomplicated: Secondary | ICD-10-CM | POA: Diagnosis not present

## 2018-04-25 DIAGNOSIS — I10 Essential (primary) hypertension: Secondary | ICD-10-CM | POA: Diagnosis not present

## 2018-04-25 MED ORDER — CEFDINIR 300 MG PO CAPS
300.0000 mg | ORAL_CAPSULE | Freq: Two times a day (BID) | ORAL | Status: DC
  Administered 2018-04-25: 01:00:00 300 mg via ORAL
  Filled 2018-04-25: qty 1

## 2018-04-25 MED ORDER — HYDROCODONE-ACETAMINOPHEN 5-325 MG PO TABS
1.0000 | ORAL_TABLET | Freq: Once | ORAL | Status: AC
  Administered 2018-04-25: 1 via ORAL
  Filled 2018-04-25: qty 1

## 2018-04-25 MED ORDER — HYDROCODONE-ACETAMINOPHEN 5-325 MG PO TABS: 1.0000 | ORAL_TABLET | Freq: Three times a day (TID) | ORAL | 0 refills | Status: AC | PRN

## 2018-04-25 MED ORDER — ACETAMINOPHEN 500 MG PO TABS: 1000.0000 mg | ORAL_TABLET | Freq: Three times a day (TID) | ORAL | 0 refills | Status: AC

## 2018-04-25 NOTE — ED Provider Notes (Signed)
[facility] EMERGENCY DEPARTMENT Provider Note  CSN: [id number] Arrival date & time: 04/24/18 2224  Chief Complaint(s) Dental Pain  HPI Shannon Vang is a 33 y.o. female a history of postpartum dental decay who presents to the emergency department with dental pain following a spontaneous fracture.  Pain began several days ago.  She has been taking Goody powders at home which initially provided some relief but now are not helping.  Pain is exacerbated with chewing and palpation.  Denies any associated fevers or chills.  No neck swelling.  No trouble swallowing or breathing.  No headache.  Denies any other physical complaints.  HPI  Past Medical History Past Medical History:  Diagnosis Date  . Hypertension    Patient Active Problem List   Diagnosis Date Noted  . 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 Hypertension 03/03/2018   Home Medication(s) Prior to Admission medications   Medication Sig Start Date End Date Taking? Authorizing Provider  acetaminophen (TYLENOL) 500 MG tablet Take 2 tablets (1,000 mg total) by mouth every 8 (eight) hours for 5 days. Do not take more than 4000 mg of acetaminophen (Tylenol) in a 24-hour period. Please note that other medicines that you may be prescribed may have Tylenol as well. 04/25/18 04/30/18  Fatima Blank, MD  HYDROcodone-acetaminophen (NORCO/VICODIN) 5-325 MG tablet Take 1 tablet by mouth every 8 (eight) hours as needed for up to 3 days for severe pain (That is not improved by your scheduled acetaminophen regimen). Please do not exceed 4000 mg of acetaminophen (Tylenol) a 24-hour period. Please note that he may be prescribed additional medicine that contains acetaminophen. 04/25/18 04/28/18  Fatima Blank, MD                                                                                                                                    Past Surgical History History reviewed. No pertinent surgical history. Family History Family  History  Problem Relation Age of Onset  . Hypertension Other     Social History Social History   Tobacco Use  . Smoking status: Current Every Day Smoker  . Smokeless tobacco: Never Used  Substance Use Topics  . Alcohol use: Yes  . Drug use: Not on file   Allergies Penicillins  Review of Systems Review of Systems All other systems are reviewed and are negative for acute change except as noted in the HPI  Physical Exam Vital Signs  I have reviewed the triage vital signs BP (!) 144/110 (BP Location: Right Arm)   Pulse 100   Temp 98.1 F (36.7 C) (Oral)   Resp 17   Ht 5\' 5"  (1.651 m)   Wt 89.8 kg   SpO2 100%   BMI 32.95 kg/m   Physical Exam Vitals signs reviewed.  Constitutional:      General: She is not in acute distress.    Appearance: She is well-developed. She is not diaphoretic.  HENT:     Head: Normocephalic and atraumatic.     Right Ear: External ear normal.     Left Ear: External ear normal.     Nose: Nose normal.     Mouth/Throat:     Dentition: Abnormal dentition. Dental tenderness present.   Eyes:     General: No scleral icterus.    Conjunctiva/sclera: Conjunctivae normal.  Neck:     Musculoskeletal: Normal range of motion.     Trachea: Phonation normal.  Cardiovascular:     Rate and Rhythm: Normal rate and regular rhythm.  Pulmonary:     Effort: Pulmonary effort is normal. No respiratory distress.     Breath sounds: No stridor.  Abdominal:     General: There is no distension.  Musculoskeletal: Normal range of motion.  Neurological:     Mental Status: She is alert and oriented to person, place, and time.  Psychiatric:        Behavior: Behavior normal.     ED Results and Treatments Labs (all labs ordered are listed, but only abnormal results are displayed) Labs Reviewed - No data to display                                                                                                                       EKG  EKG Interpretation   Date/Time:    Ventricular Rate:    PR Interval:    QRS Duration:   QT Interval:    QTC Calculation:   R Axis:     Text Interpretation:        Radiology No results found. Pertinent labs & imaging results that were available during my care of the patient were reviewed by me and considered in my medical decision making (see chart for details).  Medications Ordered in ED Medications  cefdinir (OMNICEF) capsule 300 mg (300 mg Oral Given 04/25/18 0055)  HYDROcodone-acetaminophen (NORCO/VICODIN) 5-325 MG per tablet 1 tablet (1 tablet Oral Given 04/25/18 0055)                                                                                                                                    Procedures Dental Block Date/Time: 04/25/2018 1:09 AM Performed by: Fatima Blank, MD Authorized by: Fatima Blank, MD   Consent:    Consent obtained:  Verbal   Consent given by:  Patient   Risks discussed:  Allergic reaction,  infection, intravascular injection, nerve damage, pain, swelling and unsuccessful block   Alternatives discussed:  Alternative treatment Indications:    Indications: dental pain   Location:    Block type:  Inferior alveolar   Laterality:  Left Procedure details (see MAR for exact dosages):    Needle gauge:  27 G   Anesthetic injected:  Bupivacaine 0.5% w/o epi   Injection procedure:  Anatomic landmarks identified, introduced needle, incremental injection, anatomic landmarks palpated and negative aspiration for blood Post-procedure details:    Outcome:  Anesthesia achieved   Patient tolerance of procedure:  Tolerated well, no immediate complications    (including critical care time)  Medical Decision Making / ED Course I have reviewed the nursing notes for this encounter and the patient's prior records (if available in EHR or on provided paperwork).    Dental pain following dental fracture.  No evidence of periodontal abscess.  Pain improved following  dental block. Also given Norco.  Will treat for possible pulpitis.  Patient already has an appointment with her oral surgeon in 4 days.   The patient appears reasonably screened and/or stabilized for discharge and I doubt any other medical condition or other [facility] requiring further screening, evaluation, or treatment in the ED at this time prior to discharge.  The patient is safe for discharge with strict return precautions.  Final Clinical Impression(s) / ED Diagnoses Final diagnoses:  Pain, dental   Disposition: Discharge  Condition: Good  I have discussed the results, Dx and Tx plan with the patient who expressed understanding and agree(s) with the plan. Discharge instructions discussed at great length. The patient was given strict return precautions who verbalized understanding of the instructions. No further questions at time of discharge.   [facility] narcotic database reviewed and patient does not have any active narcotic medication.  ED Discharge Orders         Ordered    acetaminophen (TYLENOL) 500 MG tablet  Every 8 hours     04/25/18 0111    HYDROcodone-acetaminophen (NORCO/VICODIN) 5-325 MG tablet  Every 8 hours PRN     04/25/18 0111           Follow Up: Orthodontist  On 04/28/2018 Scheduled     This chart was dictated using voice recognition software.  Despite best efforts to proofread,  errors can occur which can change the documentation meaning.   Fatima Blank, MD 04/25/18 0111

## 2018-04-29 DIAGNOSIS — F1120 Opioid dependence, uncomplicated: Secondary | ICD-10-CM | POA: Diagnosis not present

## 2018-05-08 DIAGNOSIS — F1120 Opioid dependence, uncomplicated: Secondary | ICD-10-CM | POA: Diagnosis not present

## 2018-05-15 DIAGNOSIS — F1120 Opioid dependence, uncomplicated: Secondary | ICD-10-CM | POA: Diagnosis not present

## 2018-05-22 DIAGNOSIS — F1120 Opioid dependence, uncomplicated: Secondary | ICD-10-CM | POA: Diagnosis not present

## 2018-05-29 DIAGNOSIS — F1120 Opioid dependence, uncomplicated: Secondary | ICD-10-CM | POA: Diagnosis not present

## 2018-08-26 ENCOUNTER — Inpatient Hospital Stay
Admit: 2018-08-26 | Discharge: 2018-08-26 | Disposition: A | Discharge status: Home / Self Care | Payer: MEDICAID | Attending: Emergency Medicine

## 2018-08-26 DIAGNOSIS — J039 Acute tonsillitis, unspecified: Secondary | ICD-10-CM

## 2018-08-26 LAB — POC GROUP A STREP
GROUP A STREP-POC,POCGPA: NEGATIVE
Group A strep (POC): NEGATIVE

## 2018-08-26 MED ORDER — CEFDINIR 300 MG CAP
300 mg | ORAL_CAPSULE | Freq: Two times a day (BID) | ORAL | 0 refills | Status: AC
Start: 2018-08-26 — End: 2018-09-05

## 2018-08-26 NOTE — ED Triage Notes (Signed)
Patient has sore throat x 3 days

## 2018-08-26 NOTE — ED Notes (Signed)
I have reviewed discharge instructions with the patient.  The patient verbalized understanding.

## 2018-08-26 NOTE — ED Provider Notes (Signed)
EMERGENCY DEPARTMENT HISTORY AND PHYSICAL EXAM    Date: 08/26/2018  Patient Name: Doris Cole    History of Presenting Illness     Chief Complaint   Patient presents with   ??? Sore Throat         History Provided By: Patient    Doris Cole is a 33 y.o. female with no PMHX who presents to the emergency department C/O sore throat. Associated sxs include fever.  She reports that 2 days of sore throat and subjective fevers.  Pt denies cough, sick contacts, and any other sxs or complaints.     PCP: Other, Phys, MD        Past History     Past Medical History:  No past medical history on file.    Past Surgical History:  No past surgical history on file.    Family History:  No family history on file.    Social History:  Social History     Tobacco Use   ??? Smoking status: Current Some Day Smoker   ??? Smokeless tobacco: Never Used   Substance Use Topics   ??? Alcohol use: Not on file   ??? Drug use: Not on file       Allergies:  Allergies   Allergen Reactions   ??? Pcn [Penicillins] Rash         Review of Systems   Review of Systems   Constitutional: Positive for chills and fever.   HENT: Positive for sore throat. Negative for congestion.    Respiratory: Negative for cough.    Gastrointestinal: Negative for vomiting.   Skin: Negative for rash.   All other systems reviewed and are negative.      Physical Exam     Vitals:    08/26/18 1442   BP: (!) 146/98   Pulse: (!) 110   Temp: 98.2 ??F (36.8 ??C)   SpO2: 100%   Weight: 83.9 kg (185 lb)   Height: 5\' 10"  (1.778 m)     Physical Exam  Vitals signs and nursing note reviewed.   Constitutional:       General: She is not in acute distress.     Appearance: She is well-developed and normal weight. She is not ill-appearing.   HENT:      Head: Normocephalic and atraumatic.      Mouth/Throat:      Mouth: Mucous membranes are moist. No oral lesions.      Pharynx: Uvula midline. Posterior oropharyngeal erythema present. No pharyngeal swelling, oropharyngeal exudate or uvula swelling.       Tonsils: No tonsillar abscesses.   Eyes:      Conjunctiva/sclera: Conjunctivae normal.      Pupils: Pupils are equal, round, and reactive to light.   Skin:     General: Skin is warm and dry.      Capillary Refill: Capillary refill takes less than 2 seconds.   Neurological:      General: No focal deficit present.      Mental Status: She is alert and oriented to person, place, and time.   Psychiatric:         Mood and Affect: Mood normal.         Behavior: Behavior normal.         Diagnostic Study Results     Labs -   No results found for this or any previous visit (from the past 12 hour(s)).    Radiologic Studies -   No orders  to display     CT Results  (Last 48 hours)    None        CXR Results  (Last 48 hours)    None          Medications given in the ED-  Medications - No data to display      Medical Decision Making   I am the first provider for this patient.    I reviewed the vital signs, available nursing notes, past medical history, past surgical history, family history and social history.    Vital Signs-Reviewed the patient's vital signs.    Pulse Oximetry Analysis - 100% on RA     Records Reviewed: Nursing Notes    Procedures:  Procedures    ED Course:   2:36 PM   Initial assessment performed. The patients presenting problems have been discussed, and they are in agreement with the care plan formulated and outlined with them.  I have encouraged them to ask questions as they arise throughout their visit.    Discussion: 33 y.o. female who is otherwise healthy with 2 days complaint of sore throat and subjective fever. She has an exudative pharyngitis on exam with evidence of PTA and managing secretions easily.  Plan for antibiotics and PCP follow-up.  Strict return precautions discussed.         Diagnosis and Disposition       DISCHARGE NOTE:  Doris Cole's  results have been reviewed with her.  She has been counseled regarding her diagnosis, treatment, and plan.  She verbally  conveys understanding and agreement of the signs, symptoms, diagnosis, treatment and prognosis and additionally agrees to follow up as discussed.  She also agrees with the care-plan and conveys that all of her questions have been answered.  I have also provided discharge instructions for her that include: educational information regarding their diagnosis and treatment, and list of reasons why they would want to return to the ED prior to their follow-up appointment, should her condition change. She has been provided with education for proper emergency department utilization.     CLINICAL IMPRESSION:    1. Acute tonsillitis, unspecified etiology        PLAN:  1. D/C Home  2.   Current Discharge Medication List      START taking these medications    Details   cefdinir (OMNICEF) 300 mg capsule Take 1 Cap by mouth two (2) times a day for 10 days.  Qty: 20 Cap, Refills: 0           3.   Follow-up Information     Follow up With Specialties Details Why Contact Info    your PCP  Schedule an appointment as soon as possible for a visit      [facility] Emergency Medicine  As needed, If symptoms worsen [address]  [facility] [id number]  [phone]    [facility]    [address]  [place] [id number]  [place] [id number]  [phone]                  Please note that this dictation was completed with Dragon, the computer voice recognition software.  Quite often unanticipated grammatical, syntax, homophones, and other interpretive errors are inadvertently transcribed by the computer software.  Please disregard these errors.  Please excuse any errors that have escaped final proofreading.

## 2018-08-26 NOTE — ED Provider Notes (Signed)
ED Provider Notes by Phillis Haggisater, Gio Janoski N, PA at 08/26/18 1445                Author: Phillis Haggisater, Missy Baksh N, PA  Service: Emergency Medicine  Author Type: Physician Assistant       Filed: 08/26/18 1452  Date of Service: 08/26/18 1445  Status: Attested           Editor: Phillis Haggisater, Nalah Macioce N, PA (Physician Assistant)  Cosigner: Lorretta HarpAgustiady-Becker, Catherine S, DO at 08/26/18 [phone]          Attestation signed by Lorretta HarpAgustiady-Becker, Catherine S, DO at 08/26/18 1618          I was personally available for consultation in the emergency department.   Vedia Cofferatherine S Agustiady-Becker, DO                                 EMERGENCY DEPARTMENT HISTORY AND PHYSICAL EXAM      Date: 08/26/2018   Patient Name: Doris CommonsJessica A Cole        History of Presenting Illness          Chief Complaint       Patient presents with        ?  Sore Throat              History Provided By: Patient      Doris Cole is a 33 y.o.  female with no PMHX who presents to the emergency department C/O sore throat. Associated sxs include fever.  She reports that 2 days of sore throat and subjective fevers.  Pt denies cough, sick contacts,  and any other sxs or complaints.       PCP: Other, Phys, MD              Past History        Past Medical History:   No past medical history on file.      Past Surgical History:   No past surgical history on file.      Family History:   No family history on file.      Social History:     Social History          Tobacco Use         ?  Smoking status:  Current Some Day Smoker     ?  Smokeless tobacco:  Never Used       Substance Use Topics         ?  Alcohol use:  Not on file         ?  Drug use:  Not on file           Allergies:     Allergies        Allergen  Reactions         ?  Pcn [Penicillins]  Rash                Review of Systems     Review of Systems    Constitutional: Positive for chills and fever .    HENT: Positive for sore throat. Negative for congestion.     Respiratory: Negative for cough.     Gastrointestinal: Negative for  vomiting.    Skin: Negative for rash.    All other systems reviewed and are negative.           Physical Exam  Vitals:          08/26/18 1442        BP:  (!) 146/98     Pulse:  (!) 110     Temp:  98.2 ??F (36.8 ??C)     SpO2:  100%     Weight:  83.9 kg (185 lb)        Height:  5\' 10"  (1.778 m)        Physical Exam   Vitals signs and nursing note reviewed.   Constitutional:        General: She is not in acute distress.     Appearance: She is well-developed and normal weight. She is not ill-appearing.    HENT:       Head: Normocephalic and atraumatic.      Mouth/Throat:      Mouth: Mucous membranes are moist. No oral lesions.      Pharynx: Uvula midline . Posterior oropharyngeal erythema present. No pharyngeal swelling, oropharyngeal exudate or uvula swelling.      Tonsils: No tonsillar  abscesses.   Eyes :       Conjunctiva/sclera: Conjunctivae normal.      Pupils: Pupils are equal, round, and reactive to light.    Skin:      General: Skin is warm and dry.      Capillary Refill: Capillary refill takes less than 2 seconds.    Neurological:       General: No focal deficit present.      Mental Status: She is alert and oriented to person, place, and time.    Psychiatric:         Mood and Affect: Mood normal.         Behavior: Behavior normal.               Diagnostic Study Results        Labs -    No results found for this or any previous visit (from the past 12 hour(s)).      Radiologic Studies -      No orders to display          CT Results   (Last 48 hours)          None                 CXR Results   (Last 48 hours)          None                  Medications given in the ED-   Medications - No data to display           Medical Decision Making     I am the first provider for this patient.      I reviewed the vital signs, available nursing notes, past medical history, past surgical history, family history and social history.      Vital Signs-Reviewed the patient's vital signs.      Pulse Oximetry Analysis - 100%  on RA       Records Reviewed: Nursing Notes      Procedures:   Procedures      ED Course:    2:36 PM    Initial assessment performed. The patients presenting problems have been discussed, and they are in agreement with the care plan formulated and outlined with them.  I have encouraged them to ask questions as they arise throughout their visit.      Discussion:  33 y.o. female  who is otherwise healthy with 2 days complaint of sore throat and subjective fever. She has an exudative pharyngitis on exam with evidence of PTA  and managing secretions easily.  Plan for antibiotics and PCP follow-up.  Strict return precautions discussed.               Diagnosis and Disposition           DISCHARGE NOTE:   Doris Cole  results have been reviewed with her .  She has been counseled regarding her  diagnosis, treatment, and plan.  She verbally conveys understanding and agreement of the signs, symptoms, diagnosis, treatment and prognosis  and additionally agrees to follow up as discussed.  She also agrees with the care-plan and conveys that all of  her questions have been answered.  I have also provided discharge instructions for her that  include: educational information regarding their diagnosis and treatment, and list of reasons why they would want to return to the ED prior to their follow-up appointment, should her  condition change. She has been provided with education for proper emergency department utilization.       CLINICAL IMPRESSION:         1.  Acute tonsillitis, unspecified etiology            PLAN:   1. D/C Home   2.      Current Discharge Medication List              START taking these medications          Details        cefdinir (OMNICEF) 300 mg capsule  Take 1 Cap by mouth two (2) times a day for 10 days.   Qty: 20 Cap, Refills:  0                      3.      Follow-up Information               Follow up With  Specialties  Details  Why  Contact Info              your PCP    Schedule an appointment as soon as  possible for a visit                  [facility] EMERGENCY DEPT  Emergency Medicine    As needed, If symptoms worsen  [address]   [facility]              [place]   [place]   [phone]                            Please note that this dictation was completed with Dragon, the computer voice recognition software.  Quite often unanticipated grammatical, syntax, homophones, and other interpretive  errors are inadvertently transcribed by the computer software.  Please disregard these errors.  Please excuse any errors that have escaped final proofreading.

## 2018-08-26 NOTE — ED Notes (Signed)
Patient has sore throat x 3 days

## 2018-08-27 NOTE — Progress Notes (Signed)
Patient contacted regarding recent discharge and COVID-19 risk.     CTN Attempt to contact patient via telephone on 08/27/18 for post EDfollow up. Unable to reach. Unable to leave a voice message. Recording " "not accepting calls at this time.'

## 2018-08-27 NOTE — Progress Notes (Signed)
 Patient contacted regarding recent discharge and COVID-19 risk.     CTN Attempt to contact patient via telephone on 08/27/18 for post EDfollow up. Unable to reach. Unable to leave a voice message. Recording  not accepting calls at this time.'

## 2018-08-28 LAB — CULTURE, THROAT
Culture result:: NORMAL
Culture: NORMAL

## 2019-10-26 ENCOUNTER — Emergency Department (HOSPITAL_COMMUNITY)
Admission: EM | Admit: 2019-10-26 | Discharge: 2019-10-26 | Disposition: A | Discharge status: Home / Self Care | Payer: Medicaid Other | Attending: Emergency Medicine | Admitting: Emergency Medicine

## 2019-10-26 ENCOUNTER — Encounter (HOSPITAL_COMMUNITY): Payer: Self-pay | Admitting: *Deleted

## 2019-10-26 ENCOUNTER — Other Ambulatory Visit: Payer: Self-pay

## 2019-10-26 DIAGNOSIS — K0381 Cracked tooth: Secondary | ICD-10-CM | POA: Diagnosis not present

## 2019-10-26 DIAGNOSIS — K029 Dental caries, unspecified: Secondary | ICD-10-CM | POA: Diagnosis not present

## 2019-10-26 DIAGNOSIS — K0889 Other specified disorders of teeth and supporting structures: Secondary | ICD-10-CM

## 2019-10-26 DIAGNOSIS — I10 Essential (primary) hypertension: Secondary | ICD-10-CM | POA: Diagnosis not present

## 2019-10-26 DIAGNOSIS — F17200 Nicotine dependence, unspecified, uncomplicated: Secondary | ICD-10-CM | POA: Diagnosis not present

## 2019-10-26 DIAGNOSIS — S025XXA Fracture of tooth (traumatic), initial encounter for closed fracture: Secondary | ICD-10-CM

## 2019-10-26 MED ORDER — IBUPROFEN 600 MG PO TABS: 600.0000 mg | ORAL_TABLET | Freq: Four times a day (QID) | ORAL | 0 refills | Status: AC | PRN

## 2019-10-26 MED ORDER — CEPHALEXIN 500 MG PO CAPS: 500.0000 mg | ORAL_CAPSULE | Freq: Four times a day (QID) | ORAL | 0 refills | Status: AC

## 2019-10-26 NOTE — ED Provider Notes (Signed)
[facility] EMERGENCY DEPARTMENT Provider Note   CSN: [id number] Arrival date & time: 10/26/19  1457     History Chief Complaint  Patient presents with  . Dental Pain    Shannon Vang is a 34 y.o. female.  The history is provided by the patient. No language interpreter was used.  Dental Pain Associated symptoms: no fever      34 year old female presenting for evaluation of dental pain.  Patient reports she has conditions where her teeth broken off.  Reports a week ago left lower teeth broke off and since then she has had persistent pain to the affected area.  Pain described as throbbing 10 out of 10 pain not adequately controlled despite taking ibuprofen, and Orajel.  No associated fever no trouble swallowing no neck pain.  No recent injury.  She initially was evaluated by her dentist prescribed amoxicillin that she took for 5 days but report no improvement.  She does have an appointment with dentist from today but states she cannot pain thus prompting this ER  Past Medical History:  Diagnosis Date  . Hypertension     Patient Active Problem List   Diagnosis Date Noted  . 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 Hypertension 03/03/2018    History reviewed. No pertinent surgical history.   OB History   No obstetric history on file.     Family History  Problem Relation Age of Onset  . Hypertension Other     Social History   Tobacco Use  . Smoking status: Current Every Day Smoker  . Smokeless tobacco: Never Used  Substance Use Topics  . Alcohol use: Yes  . Drug use: Not on file    Home Medications Prior to Admission medications   Not on File    Allergies    Penicillins  Review of Systems   Review of Systems  Constitutional: Negative for fever.  HENT: Positive for dental problem.     Physical Exam Updated Vital Signs BP (!) 151/100 (BP Location: Right Arm)   Pulse 74   Temp 98.2 F (36.8 C) (Oral)   Resp 18   Ht 5\' 4"  (1.626 m)   Wt 84.4 kg    SpO2 100%   BMI 31.93 kg/m   Physical Exam Vitals and nursing note reviewed.  Constitutional:      General: She is not in acute distress.    Appearance: She is well-developed.  HENT:     Head: Atraumatic.     Mouth/Throat:     Comments: Dental decay noted to tooth #18 and 19 with tenderness to palpation but no surrounding gingival erythema or abscess noted.  No dental intrusion or extrusion.  No trismus.  No facial involvement. Eyes:     Conjunctiva/sclera: Conjunctivae normal.  Musculoskeletal:     Cervical back: Neck supple.  Skin:    Findings: No rash.  Neurological:     Mental Status: She is alert.     ED Results / Procedures / Treatments   Labs (all labs ordered are listed, but only abnormal results are displayed) Labs Reviewed - No data to display  EKG None  Radiology No results found.  Procedures Procedures (including critical care time)  Medications Ordered in ED Medications - No data to display  ED Course  I have reviewed the triage vital signs and the nursing notes.  Pertinent labs & imaging results that were available during my care of the patient were reviewed by me and considered in my medical decision making (  see chart for details).    MDM Rules/Calculators/A&P                          BP (!) 151/100 (BP Location: Right Arm)   Pulse 74   Temp 98.2 F (36.8 C) (Oral)   Resp 18   Ht 5\' 4"  (1.626 m)   Wt 84.4 kg   SpO2 100%   BMI 31.93 kg/m   Final Clinical Impression(s) / ED Diagnoses Final diagnoses:  Pain, dental  Closed fracture of tooth, initial encounter    Rx / DC Orders ED Discharge Orders         Ordered    cephALEXin (KEFLEX) 500 MG capsule  4 times daily        10/26/19 1704    ibuprofen (ADVIL) 600 MG tablet  Every 6 hours PRN        10/26/19 1704         4:41 PM Patient with recurrent dental pain.  She has suggestion of bilateral anterior.  She has tenderness involving her #18 #19 teeth without abscess.  Plan to  provide antibiotic and dental referral.  Okay for lollipop lidocaine gel for pain relief.   Domenic Moras, PA-C 10/26/19 1706    Tegeler, Gwenyth Allegra, MD 10/26/19 2325

## 2019-10-26 NOTE — ED Triage Notes (Signed)
Pt is here due to severe pain from two broken teeth in left lower jaw.  Pt is waiting for a dental appointment next Thursday but the pain is unbearable and pt has taken over the recommended OTC pain medication dose without relief.

## 2019-10-26 NOTE — ED Notes (Signed)
Patient verbalizes understanding of discharge instructions. Opportunity for questioning and answers were provided. Armband removed by staff, pt discharged from ED.  

## 2019-10-26 NOTE — Discharge Instructions (Addendum)
Please use dental resource guide below to find dentist for further managements of recurrent dental pain.

## 2019-11-06 ENCOUNTER — Emergency Department (HOSPITAL_COMMUNITY)
Admission: EM | Admit: 2019-11-06 | Discharge: 2019-11-06 | Disposition: A | Discharge status: Home / Self Care | Payer: Medicaid Other | Attending: Emergency Medicine | Admitting: Emergency Medicine

## 2019-11-06 ENCOUNTER — Encounter (HOSPITAL_COMMUNITY): Payer: Self-pay

## 2019-11-06 ENCOUNTER — Other Ambulatory Visit: Payer: Self-pay

## 2019-11-06 ENCOUNTER — Emergency Department (HOSPITAL_COMMUNITY): Payer: Medicaid Other

## 2019-11-06 DIAGNOSIS — Z5321 Procedure and treatment not carried out due to patient leaving prior to being seen by health care provider: Secondary | ICD-10-CM | POA: Insufficient documentation

## 2019-11-06 DIAGNOSIS — R079 Chest pain, unspecified: Secondary | ICD-10-CM | POA: Insufficient documentation

## 2019-11-06 DIAGNOSIS — R519 Headache, unspecified: Secondary | ICD-10-CM | POA: Insufficient documentation

## 2019-11-06 DIAGNOSIS — R002 Palpitations: Secondary | ICD-10-CM | POA: Insufficient documentation

## 2019-11-06 LAB — CBC
HCT: 33 % — ABNORMAL LOW (ref 36.0–46.0)
Hemoglobin: 10.6 g/dL — ABNORMAL LOW (ref 12.0–15.0)
MCH: 28 pg (ref 26.0–34.0)
MCHC: 32.1 g/dL (ref 30.0–36.0)
MCV: 87.1 fL (ref 80.0–100.0)
Platelets: 378 10*3/uL (ref 150–400)
RBC: 3.79 MIL/uL — ABNORMAL LOW (ref 3.87–5.11)
RDW: 13.2 % (ref 11.5–15.5)
WBC: 8.3 10*3/uL (ref 4.0–10.5)
nRBC: 0 % (ref 0.0–0.2)

## 2019-11-06 LAB — BASIC METABOLIC PANEL
Anion gap: 7 (ref 5–15)
BUN: 8 mg/dL (ref 6–20)
CO2: 26 mmol/L (ref 22–32)
Calcium: 8.8 mg/dL — ABNORMAL LOW (ref 8.9–10.3)
Chloride: 105 mmol/L (ref 98–111)
Creatinine, Ser: 0.69 mg/dL (ref 0.44–1.00)
GFR, Estimated: >60 mL/min (ref >60)
Glucose, Bld: 99 mg/dL (ref 70–99)
Potassium: 3.4 mmol/L — ABNORMAL LOW (ref 3.5–5.1)
Sodium: 138 mmol/L (ref 135–145)

## 2019-11-06 LAB — I-STAT BETA HCG BLOOD, ED (MC, WL, AP ONLY): I-stat hCG, quantitative: <5 m[IU]/mL (ref <5)

## 2019-11-06 LAB — TROPONIN I (HIGH SENSITIVITY)
Troponin I (High Sensitivity): 3 ng/L (ref <18)
Troponin I (High Sensitivity): 3 ng/L (ref <18)

## 2019-11-06 NOTE — ED Notes (Signed)
Patient stated that she will follow up with Cards Tuesday.

## 2019-11-06 NOTE — ED Triage Notes (Signed)
Pt reports chest pain that started this afternoon, squeezing pain lasting about 2 hrs with a headache. Pt seen by cardiologist this morning, has heart monitor on and an ECHO scheduled soon.

## 2019-11-07 ENCOUNTER — Other Ambulatory Visit: Payer: Self-pay

## 2019-11-07 ENCOUNTER — Emergency Department (HOSPITAL_BASED_OUTPATIENT_CLINIC_OR_DEPARTMENT_OTHER)
Admission: EM | Admit: 2019-11-07 | Discharge: 2019-11-07 | Disposition: A | Discharge status: Home / Self Care | Payer: Medicaid Other | Attending: Emergency Medicine | Admitting: Emergency Medicine

## 2019-11-07 ENCOUNTER — Emergency Department (HOSPITAL_BASED_OUTPATIENT_CLINIC_OR_DEPARTMENT_OTHER): Payer: Medicaid Other

## 2019-11-07 ENCOUNTER — Encounter (HOSPITAL_BASED_OUTPATIENT_CLINIC_OR_DEPARTMENT_OTHER): Payer: Self-pay | Admitting: Emergency Medicine

## 2019-11-07 DIAGNOSIS — Z20822 Contact with and (suspected) exposure to covid-19: Secondary | ICD-10-CM | POA: Diagnosis not present

## 2019-11-07 DIAGNOSIS — Z87891 Personal history of nicotine dependence: Secondary | ICD-10-CM | POA: Diagnosis not present

## 2019-11-07 DIAGNOSIS — I10 Essential (primary) hypertension: Secondary | ICD-10-CM | POA: Diagnosis not present

## 2019-11-07 DIAGNOSIS — R519 Headache, unspecified: Secondary | ICD-10-CM | POA: Insufficient documentation

## 2019-11-07 DIAGNOSIS — R600 Localized edema: Secondary | ICD-10-CM | POA: Diagnosis not present

## 2019-11-07 DIAGNOSIS — R072 Precordial pain: Secondary | ICD-10-CM | POA: Diagnosis present

## 2019-11-07 DIAGNOSIS — R202 Paresthesia of skin: Secondary | ICD-10-CM | POA: Diagnosis not present

## 2019-11-07 DIAGNOSIS — R0602 Shortness of breath: Secondary | ICD-10-CM | POA: Insufficient documentation

## 2019-11-07 LAB — HEPATIC FUNCTION PANEL
ALT: 22 U/L (ref 0–44)
AST: 33 U/L (ref 15–41)
Albumin: 3.5 g/dL (ref 3.5–5.0)
Alkaline Phosphatase: 66 U/L (ref 38–126)
Bilirubin, Direct: 0.1 mg/dL (ref 0.0–0.2)
Indirect Bilirubin: 0.3 mg/dL (ref 0.3–0.9)
Total Bilirubin: 0.4 mg/dL (ref 0.3–1.2)
Total Protein: 6.4 g/dL — ABNORMAL LOW (ref 6.5–8.1)

## 2019-11-07 LAB — BASIC METABOLIC PANEL
Anion gap: 8 (ref 5–15)
BUN: 9 mg/dL (ref 6–20)
CO2: 25 mmol/L (ref 22–32)
Calcium: 8.8 mg/dL — ABNORMAL LOW (ref 8.9–10.3)
Chloride: 104 mmol/L (ref 98–111)
Creatinine, Ser: 0.57 mg/dL (ref 0.44–1.00)
GFR, Estimated: >60 mL/min (ref >60)
Glucose, Bld: 108 mg/dL — ABNORMAL HIGH (ref 70–99)
Potassium: 3.7 mmol/L (ref 3.5–5.1)
Sodium: 137 mmol/L (ref 135–145)

## 2019-11-07 LAB — RESPIRATORY PANEL BY RT PCR (FLU A&B, COVID)
Influenza A by PCR: NEGATIVE
Influenza B by PCR: NEGATIVE
SARS Coronavirus 2 by RT PCR: NEGATIVE

## 2019-11-07 LAB — PREGNANCY, URINE: Preg Test, Ur: NEGATIVE

## 2019-11-07 LAB — CBC
HCT: 32.6 % — ABNORMAL LOW (ref 36.0–46.0)
Hemoglobin: 10.6 g/dL — ABNORMAL LOW (ref 12.0–15.0)
MCH: 28.1 pg (ref 26.0–34.0)
MCHC: 32.5 g/dL (ref 30.0–36.0)
MCV: 86.5 fL (ref 80.0–100.0)
Platelets: 387 10*3/uL (ref 150–400)
RBC: 3.77 MIL/uL — ABNORMAL LOW (ref 3.87–5.11)
RDW: 13.4 % (ref 11.5–15.5)
WBC: 10.2 10*3/uL (ref 4.0–10.5)
nRBC: 0 % (ref 0.0–0.2)

## 2019-11-07 LAB — TROPONIN I (HIGH SENSITIVITY)
Troponin I (High Sensitivity): 2 ng/L (ref <18)
Troponin I (High Sensitivity): <2 ng/L (ref <18)

## 2019-11-07 LAB — BRAIN NATRIURETIC PEPTIDE: B Natriuretic Peptide: 56.4 pg/mL (ref 0.0–100.0)

## 2019-11-07 LAB — D-DIMER, QUANTITATIVE: D-Dimer, Quant: 0.61 ug/mL-FEU — ABNORMAL HIGH (ref 0.00–0.50)

## 2019-11-07 MED ORDER — IOHEXOL 350 MG/ML SOLN
100.0000 mL | Freq: Once | INTRAVENOUS | Status: AC | PRN
  Administered 2019-11-07: 100 mL via INTRAVENOUS

## 2019-11-07 MED ORDER — FUROSEMIDE 20 MG PO TABS: 20.0000 mg | ORAL_TABLET | Freq: Every day | ORAL | 0 refills | Status: AC

## 2019-11-07 NOTE — ED Triage Notes (Signed)
Pt arrives pov with c/o mid sternal chest pain and lower extremity swelling since yesterday. Pt wearing heart monitor, reports being told to be checked in Ed by Cardiologist

## 2019-11-07 NOTE — ED Notes (Signed)
Patient transported to CT 

## 2019-11-07 NOTE — ED Notes (Signed)
Patient transported to X-ray 

## 2019-11-07 NOTE — Discharge Instructions (Signed)
You were seen in the emergency room today with shortness of breath and swelling in your legs.  We did a work-up today looking for evidence of heart failure, heart attack, blood clots in the lungs and did not find any evidence of this.  Your Covid test is also negative.  I am starting you on Lasix for the next 5 days to help with your leg swelling.  Please keep your legs elevated above the level of your heart when you are at rest to help with swelling as well.  Please keep your appointment for the cardiologist tomorrow.  Return to the emergency department any new or suddenly worsening symptoms.

## 2019-11-07 NOTE — ED Provider Notes (Signed)
Emergency Department Provider Note   I have reviewed the triage vital signs and the nursing notes.   HISTORY  Chief Complaint Chest Pain   HPI Shannon Vang is a 34 y.o. female with past medical history of hypertension presents to the emergency department for evaluation of substernal chest discomfort which occurs intermittently, lower extremity swelling, palpitations, and dyspnea.  Patient is followed at [facility] by Dr. Brigitte Pulse.  Her symptoms have worsened significantly over the past 2 weeks.  She is currently undergoing ambulatory monitoring and scheduled for an echo tomorrow.  She has intermittent squeezing/tight pain in the center of her chest although not currently.  No clear modifying factors.  No radiation of symptoms.  No fevers, chills, congestion.  Patient does have intermittent headache symptoms which she attributes to her elevated blood pressure at times.    Past Medical History:  Diagnosis Date  . Hypertension     Patient Active Problem List   Diagnosis Date Noted  . 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 Hypertension 03/03/2018    Past Surgical History:  Procedure Laterality Date  . BACK SURGERY      Allergies Penicillins  Family History  Problem Relation Age of Onset  . Hypertension Other     Social History Social History   Tobacco Use  . Smoking status: Former Research scientist (life sciences)  . Smokeless tobacco: Never Used  Substance Use Topics  . Alcohol use: Not Currently  . Drug use: Never    Review of Systems  Constitutional: No fever/chills Eyes: No visual changes. ENT: No sore throat. Cardiovascular: Positive chest pain and palpitations. Positive B/L LE swelling.  Respiratory: Positive shortness of breath. Gastrointestinal: No abdominal pain.  No nausea, no vomiting.  No diarrhea.  No constipation. Genitourinary: Negative for dysuria. Musculoskeletal: Negative for back pain. Skin: Negative for rash. Neurological: Negative for headaches, focal weakness or  numbness.  10-point ROS otherwise negative.  ____________________________________________   PHYSICAL EXAM:  VITAL SIGNS: ED Triage Vitals  Enc Vitals Group     BP 11/07/19 0949 (!) 130/105     Pulse Rate 11/07/19 1034 90     Resp 11/07/19 0949 20     Temp 11/07/19 0949 98.1 F (36.7 C)     Temp Source 11/07/19 0949 Oral     SpO2 11/07/19 0949 100 %     Weight 11/07/19 0950 192 lb (87.1 kg)     Height 11/07/19 0950 5\' 4"  (1.626 m)   Constitutional: Alert and oriented. Well appearing and in no acute distress. Eyes: Conjunctivae are normal.  Head: Atraumatic. Nose: No congestion/rhinnorhea. Mouth/Throat: Mucous membranes are moist.  Neck: No stridor.   Cardiovascular: Normal rate, regular rhythm. Good peripheral circulation. Grossly normal heart sounds.   Respiratory: Normal respiratory effort.  No retractions. Lungs CTAB. Gastrointestinal: Soft and nontender. No distention.  Musculoskeletal: No lower extremity tenderness with 2+ pitting edema bilaterally. No gross deformities of extremities. Neurologic:  Normal speech and language. Skin:  Skin is warm, dry and intact. No rash noted.  ____________________________________________   LABS (all labs ordered are listed, but only abnormal results are displayed)  Labs Reviewed  BASIC METABOLIC PANEL - Abnormal; Notable for the following components:      Result Value   Glucose, Bld 108 (*)    Calcium 8.8 (*)    All other components within normal limits  CBC - Abnormal; Notable for the following components:   RBC 3.77 (*)    Hemoglobin 10.6 (*)    HCT 32.6 (*)  All other components within normal limits  HEPATIC FUNCTION PANEL - Abnormal; Notable for the following components:   Total Protein 6.4 (*)    All other components within normal limits  D-DIMER, QUANTITATIVE (NOT AT [facility]) - Abnormal; Notable for the following components:   D-Dimer, Quant 0.61 (*)    All other components within normal limits  RESPIRATORY PANEL BY  RT PCR (FLU A&B, COVID)  PREGNANCY, URINE  BRAIN NATRIURETIC PEPTIDE  TROPONIN I (HIGH SENSITIVITY)  TROPONIN I (HIGH SENSITIVITY)   ____________________________________________  EKG   EKG Interpretation  Date/Time:  Tuesday November 07 2019 09:47:47 EDT Ventricular Rate:  100 PR Interval:  166 QRS Duration: 78 QT Interval:  364 QTC Calculation: 469 R Axis:   52 Text Interpretation: Normal sinus rhythm Normal ECG No STEMI Confirmed by Nanda Quinton [phone]) on 11/07/2019 10:16:04 AM       ____________________________________________  RADIOLOGY  DG Chest 2 View  Result Date: 11/07/2019 CLINICAL DATA:  Mid sternal chest pain, shortness of breath, lower extremity swelling since yesterday EXAM: CHEST - 2 VIEW COMPARISON:  11/06/2019 FINDINGS: Electronic device projects over the lateral LEFT upper hemithorax. Upper normal heart size. Mediastinal contours and pulmonary vascularity normal. Minimal chronic peribronchial thickening. No acute infiltrate, pleural effusion or pneumothorax. Osseous structures unremarkable. IMPRESSION: No acute abnormalities. Electronically Signed   By: Lavonia Dana M.D.   On: 11/07/2019 10:34   DG Chest 2 View  Result Date: 11/06/2019 CLINICAL DATA:  Squeezing chest pain this afternoon, headache EXAM: CHEST - 2 VIEW COMPARISON:  03/03/2018 FINDINGS: Frontal and lateral views of the chest demonstrate a stable cardiac silhouette. No airspace disease, effusion, or pneumothorax. No acute bony abnormality. IMPRESSION: 1. Stable exam, no acute process. Electronically Signed   By: Randa Ngo M.D.   On: 11/06/2019 17:46    ____________________________________________   PROCEDURES  Procedure(s) performed:   Procedures  None  ____________________________________________   INITIAL IMPRESSION / ASSESSMENT AND PLAN / ED COURSE  Pertinent labs & imaging results that were available during my care of the patient were reviewed by me and considered in my medical  decision making (see chart for details).   Patient presents to the emergency department with chest discomfort along with palpitations and lower extremity swelling.  She is following with an outpatient provider and is currently wearing an ambulatory heart monitor and is scheduled for echo tomorrow.  She does not appear to be fluid overload in her cardiorespiratory system but does have significant bilateral lower extremity pitting edema.  Lab work reviewed and largely unremarkable with exception of some mildly elevated D-dimer.  CTA of the chest performed showing no PE or significant pulmonary edema.  No pneumonia.  Plan to keep the appointment tomorrow for echo and will start a short course of outpatient Lasix.  Additional diuresis and treatment per the patient's outpatient provider.  Discussed ED return precautions.  Patient is ambulatory in the emergency department on reassessment.  She is in no acute respiratory distress.  Discussed the results and plan with her in detail and she is pleased with the plan at discharge.   ____________________________________________  FINAL CLINICAL IMPRESSION(S) / ED DIAGNOSES  Final diagnoses:  Precordial chest pain  Bilateral lower extremity edema  SOB (shortness of breath)     MEDICATIONS GIVEN DURING THIS VISIT:  Medications  iohexol (OMNIPAQUE) 350 MG/ML injection 100 mL (100 mLs Intravenous Contrast Given 11/07/19 1227)     NEW OUTPATIENT MEDICATIONS STARTED DURING THIS VISIT:  Discharge Medication List as  of 11/07/2019  1:34 PM    START taking these medications   Details  furosemide (LASIX) 20 MG tablet Take 1 tablet (20 mg total) by mouth daily for 5 days., Starting Tue 11/07/2019, Until Sun 11/12/2019, Normal        Note:  This document was prepared using Dragon voice recognition software and may include unintentional dictation errors.  Nanda Quinton, MD, [facility] Emergency Medicine    Kathlyne Loud, Wonda Olds, MD 11/10/19 [phone]

## 2019-11-07 NOTE — ED Notes (Signed)
Ambulated with pulse ox, reading 97-98%, HR at baseline 108-110, denies shortness of breath, denies chest pain, does report some mild dizziness after completing walk around the unit

## 2019-11-08 ENCOUNTER — Telehealth: Payer: Medicaid Other | Admitting: Family

## 2019-11-08 DIAGNOSIS — J069 Acute upper respiratory infection, unspecified: Secondary | ICD-10-CM | POA: Diagnosis not present

## 2019-11-08 MED ORDER — BENZONATATE 100 MG PO CAPS: 100.0000 mg | ORAL_CAPSULE | Freq: Three times a day (TID) | ORAL | 0 refills | Status: AC | PRN

## 2019-11-08 MED ORDER — FLUTICASONE PROPIONATE 50 MCG/ACT NA SUSP: 2.0000 | Freq: Every day | NASAL | 6 refills | Status: AC

## 2019-11-08 NOTE — Progress Notes (Signed)

## 2019-11-11 ENCOUNTER — Encounter (HOSPITAL_BASED_OUTPATIENT_CLINIC_OR_DEPARTMENT_OTHER): Payer: Self-pay | Admitting: Emergency Medicine

## 2019-11-11 ENCOUNTER — Other Ambulatory Visit: Payer: Self-pay

## 2019-11-11 ENCOUNTER — Emergency Department (HOSPITAL_BASED_OUTPATIENT_CLINIC_OR_DEPARTMENT_OTHER): Payer: Medicaid Other

## 2019-11-11 ENCOUNTER — Emergency Department (HOSPITAL_BASED_OUTPATIENT_CLINIC_OR_DEPARTMENT_OTHER)
Admission: EM | Admit: 2019-11-11 | Discharge: 2019-11-11 | Disposition: A | Discharge status: Home / Self Care | Payer: Medicaid Other | Attending: Emergency Medicine | Admitting: Emergency Medicine

## 2019-11-11 DIAGNOSIS — Z20822 Contact with and (suspected) exposure to covid-19: Secondary | ICD-10-CM | POA: Insufficient documentation

## 2019-11-11 DIAGNOSIS — Z7901 Long term (current) use of anticoagulants: Secondary | ICD-10-CM | POA: Diagnosis not present

## 2019-11-11 DIAGNOSIS — Z87891 Personal history of nicotine dependence: Secondary | ICD-10-CM | POA: Insufficient documentation

## 2019-11-11 DIAGNOSIS — H6592 Unspecified nonsuppurative otitis media, left ear: Secondary | ICD-10-CM | POA: Diagnosis not present

## 2019-11-11 DIAGNOSIS — I10 Essential (primary) hypertension: Secondary | ICD-10-CM | POA: Diagnosis not present

## 2019-11-11 DIAGNOSIS — R000 Tachycardia, unspecified: Secondary | ICD-10-CM | POA: Diagnosis not present

## 2019-11-11 DIAGNOSIS — J069 Acute upper respiratory infection, unspecified: Secondary | ICD-10-CM

## 2019-11-11 DIAGNOSIS — R059 Cough, unspecified: Secondary | ICD-10-CM | POA: Diagnosis present

## 2019-11-11 LAB — RESPIRATORY PANEL BY RT PCR (FLU A&B, COVID)
Influenza A by PCR: NEGATIVE
Influenza B by PCR: NEGATIVE
SARS Coronavirus 2 by RT PCR: NEGATIVE

## 2019-11-11 MED ORDER — AZITHROMYCIN 250 MG PO TABS: 250.0000 mg | ORAL_TABLET | Freq: Every day | ORAL | 0 refills | Status: AC

## 2019-11-11 NOTE — ED Triage Notes (Signed)
Was seen a few days ago for chest pain and swelling to lower ext.  Was seen by cardiology.  Now reports productive cough with thick brown sputum with sob.  Also reports left earache.

## 2019-11-11 NOTE — ED Provider Notes (Signed)
[facility] EMERGENCY DEPARTMENT Provider Note   CSN: [id number] Arrival date & time: 11/11/19  1521     History Chief Complaint  Patient presents with  . Shortness of Breath  . Cough  . Otalgia    Shannon Vang is a 34 y.o. female.  Patient is a 34 year old female who presents with URI symptoms.  She reports a 3-day history of coughing productive of some thick brown sputum, runny nose, sore throat and left ear pain.  She has had some fevers up to 102.  She has some intermittent shortness of breath, more with coughing.  No vomiting or diarrhea.  She has had some swollen glands in her neck.  Of note, she recently had a cardiology evaluation due to some ongoing chest pain and tachycardia.  She had an echo and a Holter monitor but she does not have the results of these tests yet.  She had some leg swelling which has improved with Lasix.  She is not currently still on the Lasix.  She notes that her heart rate has been fast for the last couple months up into the 150s.  She says her baseline heart rate is around 100 or more.  Her thyroid was reportedly checked by the cardiologist and she has been referred to an endocrinologist.        Past Medical History:  Diagnosis Date  . Hypertension     Patient Active Problem List   Diagnosis Date Noted  . 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 Hypertension 03/03/2018    Past Surgical History:  Procedure Laterality Date  . BACK SURGERY       OB History   No obstetric history on file.     Family History  Problem Relation Age of Onset  . Hypertension Other     Social History   Tobacco Use  . Smoking status: Former Research scientist (life sciences)  . Smokeless tobacco: Never Used  Substance Use Topics  . Alcohol use: Not Currently  . Drug use: Never    Home Medications Prior to Admission medications   Medication Sig Start Date End Date Taking? Authorizing Provider  azithromycin (ZITHROMAX) 250 MG tablet Take 1 tablet (250 mg total) by mouth daily. Take  first 2 tablets together, then 1 every day until finished. 11/11/19   Malvin Johns, MD  benzonatate (TESSALON PERLES) 100 MG capsule Take 1 capsule (100 mg total) by mouth 3 (three) times daily as needed. 11/08/19   Evelina Dun A, FNP  cephALEXin (KEFLEX) 500 MG capsule Take 1 capsule (500 mg total) by mouth 4 (four) times daily. 10/26/19   Domenic Moras, PA-C  fluticasone (FLONASE) 50 MCG/ACT nasal spray Place 2 sprays into both nostrils daily. 11/08/19   Sharion Balloon, FNP  furosemide (LASIX) 20 MG tablet Take 1 tablet (20 mg total) by mouth daily for 5 days. 11/07/19 11/12/19  Long, Wonda Olds, MD  ibuprofen (ADVIL) 600 MG tablet Take 1 tablet (600 mg total) by mouth every 6 (six) hours as needed. 10/26/19   Domenic Moras, PA-C    Allergies    Penicillins  Review of Systems   Review of Systems  Constitutional: Positive for fatigue and fever. Negative for chills and diaphoresis.  HENT: Positive for ear pain, rhinorrhea and voice change (Some hoarseness which has improved). Negative for congestion, facial swelling and sneezing.   Eyes: Negative.   Respiratory: Positive for cough and shortness of breath. Negative for chest tightness.   Cardiovascular: Negative for chest pain and leg swelling.  Gastrointestinal:  Negative for abdominal pain, blood in stool, diarrhea, nausea and vomiting.  Genitourinary: Negative for difficulty urinating, flank pain, frequency and hematuria.  Musculoskeletal: Negative for arthralgias and back pain.  Skin: Negative for rash.  Neurological: Positive for light-headedness. Negative for speech difficulty, weakness, numbness and headaches.    Physical Exam Updated Vital Signs BP (!) 142/96 (BP Location: Left Arm)   Pulse (!) 123   Temp 98.6 F (37 C) (Oral)   Resp 20   Ht 5\' 5"  (1.651 m)   Wt 89.8 kg   LMP 10/23/2019 (Approximate)   SpO2 98%   BMI 32.95 kg/m   Physical Exam Constitutional:      Appearance: She is well-developed.  HENT:     Head:  Normocephalic and atraumatic.     Right Ear: Tympanic membrane normal.     Left Ear: A middle ear effusion is present. Tympanic membrane is injected and bulging.     Mouth/Throat:     Mouth: Mucous membranes are moist.     Pharynx: No pharyngeal swelling or oropharyngeal exudate.     Comments: No erythema or exudates, uvula is midline, mild cervical lymphadenopathy bilaterally, no trismus Eyes:     Pupils: Pupils are equal, round, and reactive to light.  Cardiovascular:     Rate and Rhythm: Regular rhythm. Tachycardia present.     Heart sounds: Normal heart sounds.     Comments: Heart rate on my exam is 102-104 Pulmonary:     Effort: Pulmonary effort is normal. No respiratory distress.     Breath sounds: Normal breath sounds. No wheezing or rales.  Chest:     Chest wall: No tenderness.  Abdominal:     General: Bowel sounds are normal.     Palpations: Abdomen is soft.     Tenderness: There is no abdominal tenderness. There is no guarding or rebound.  Musculoskeletal:        General: Normal range of motion.     Cervical back: Normal range of motion and neck supple.  Lymphadenopathy:     Cervical: No cervical adenopathy.  Skin:    General: Skin is warm and dry.     Findings: No rash.  Neurological:     Mental Status: She is alert and oriented to person, place, and time.     ED Results / Procedures / Treatments   Labs (all labs ordered are listed, but only abnormal results are displayed) Labs Reviewed  RESPIRATORY PANEL BY RT PCR (FLU A&B, COVID)    EKG EKG Interpretation  Date/Time:  Saturday November 11 2019 15:33:32 EDT Ventricular Rate:  129 PR Interval:  126 QRS Duration: 70 QT Interval:  302 QTC Calculation: 442 R Axis:   91 Text Interpretation: Sinus tachycardia Right atrial enlargement Rightward axis Borderline ECG SINCE LAST TRACING HEART RATE HAS INCREASED Confirmed by Malvin Johns [phone]) on 11/11/2019 4:09:47 PM   Radiology DG Chest 2 View  Result  Date: 11/11/2019 CLINICAL DATA:  Productive cough with sputum. Shortness of breath for 3 days. EXAM: CHEST - 2 VIEW COMPARISON:  Chest radiograph 11/07/2019 FINDINGS: The heart size and mediastinal contours are within normal limits. Possible tiny opacity at the left base, favor atelectasis. Right lung is clear. No pneumothorax or pleural effusion. No acute finding in the visualized skeleton. IMPRESSION: Possible tiny opacity at the left base, favor atelectasis. Electronically Signed   By: Audie Pinto M.D.   On: 11/11/2019 16:19    Procedures Procedures (including critical care time)  Medications Ordered  in ED Medications - No data to display  ED Course  I have reviewed the triage vital signs and the nursing notes.  Pertinent labs & imaging results that were available during my care of the patient were reviewed by me and considered in my medical decision making (see chart for details).    MDM Rules/Calculators/A&P                          Patient is a 35 year old female who presents with URI symptoms and left ear pain.  Her left ear appears to be consistent with an otitis media.  She is mildly tachycardic.  Her heart rate was in the 120s on arrival.  On my exam it is in the low 100s.  She does not currently report any shortness of breath.  Her lungs are clear.  She says her baseline heart rate is elevated and has been for the last few months.  She says this is not atypical for her over the last couple of months.  Chest x-ray shows some haziness in the left base.  Possible atelectasis versus pneumonia.  She is overall well-appearing.  She does not have any clinical symptoms that seem more suggestive of PE.  Her symptoms are consistent with a most likely viral URI with a left otitis media.  I will go ahead and put her on antibiotics.  Her chest x-ray is less suggestive of pneumonia however I am putting her on antibiotics for her otitis media.  She has a penicillin allergy and was started on  Zithromax.  Covid test was performed and is pending.  She was advised to quarantine until her Covid test returns.  She was advised to follow-up with her PCP if her symptoms are not improving.  Return precautions were given. Final Clinical Impression(s) / ED Diagnoses Final diagnoses:  Viral upper respiratory tract infection  OME (otitis media with effusion), left    Rx / DC Orders ED Discharge Orders         Ordered    azithromycin (ZITHROMAX) 250 MG tablet  Daily        11/11/19 1654           Malvin Johns, MD 11/11/19 1701

## 2020-01-17 ENCOUNTER — Encounter: Payer: Self-pay | Admitting: Endocrinology

## 2020-12-02 IMAGING — CR DG CHEST 2V
2 series · 2 of 2 positions shown · non-contrast
Comparison: 03/03/2018

CLINICAL DATA: Fever

EXAM:
CHEST - 2 VIEW

[chest pa]
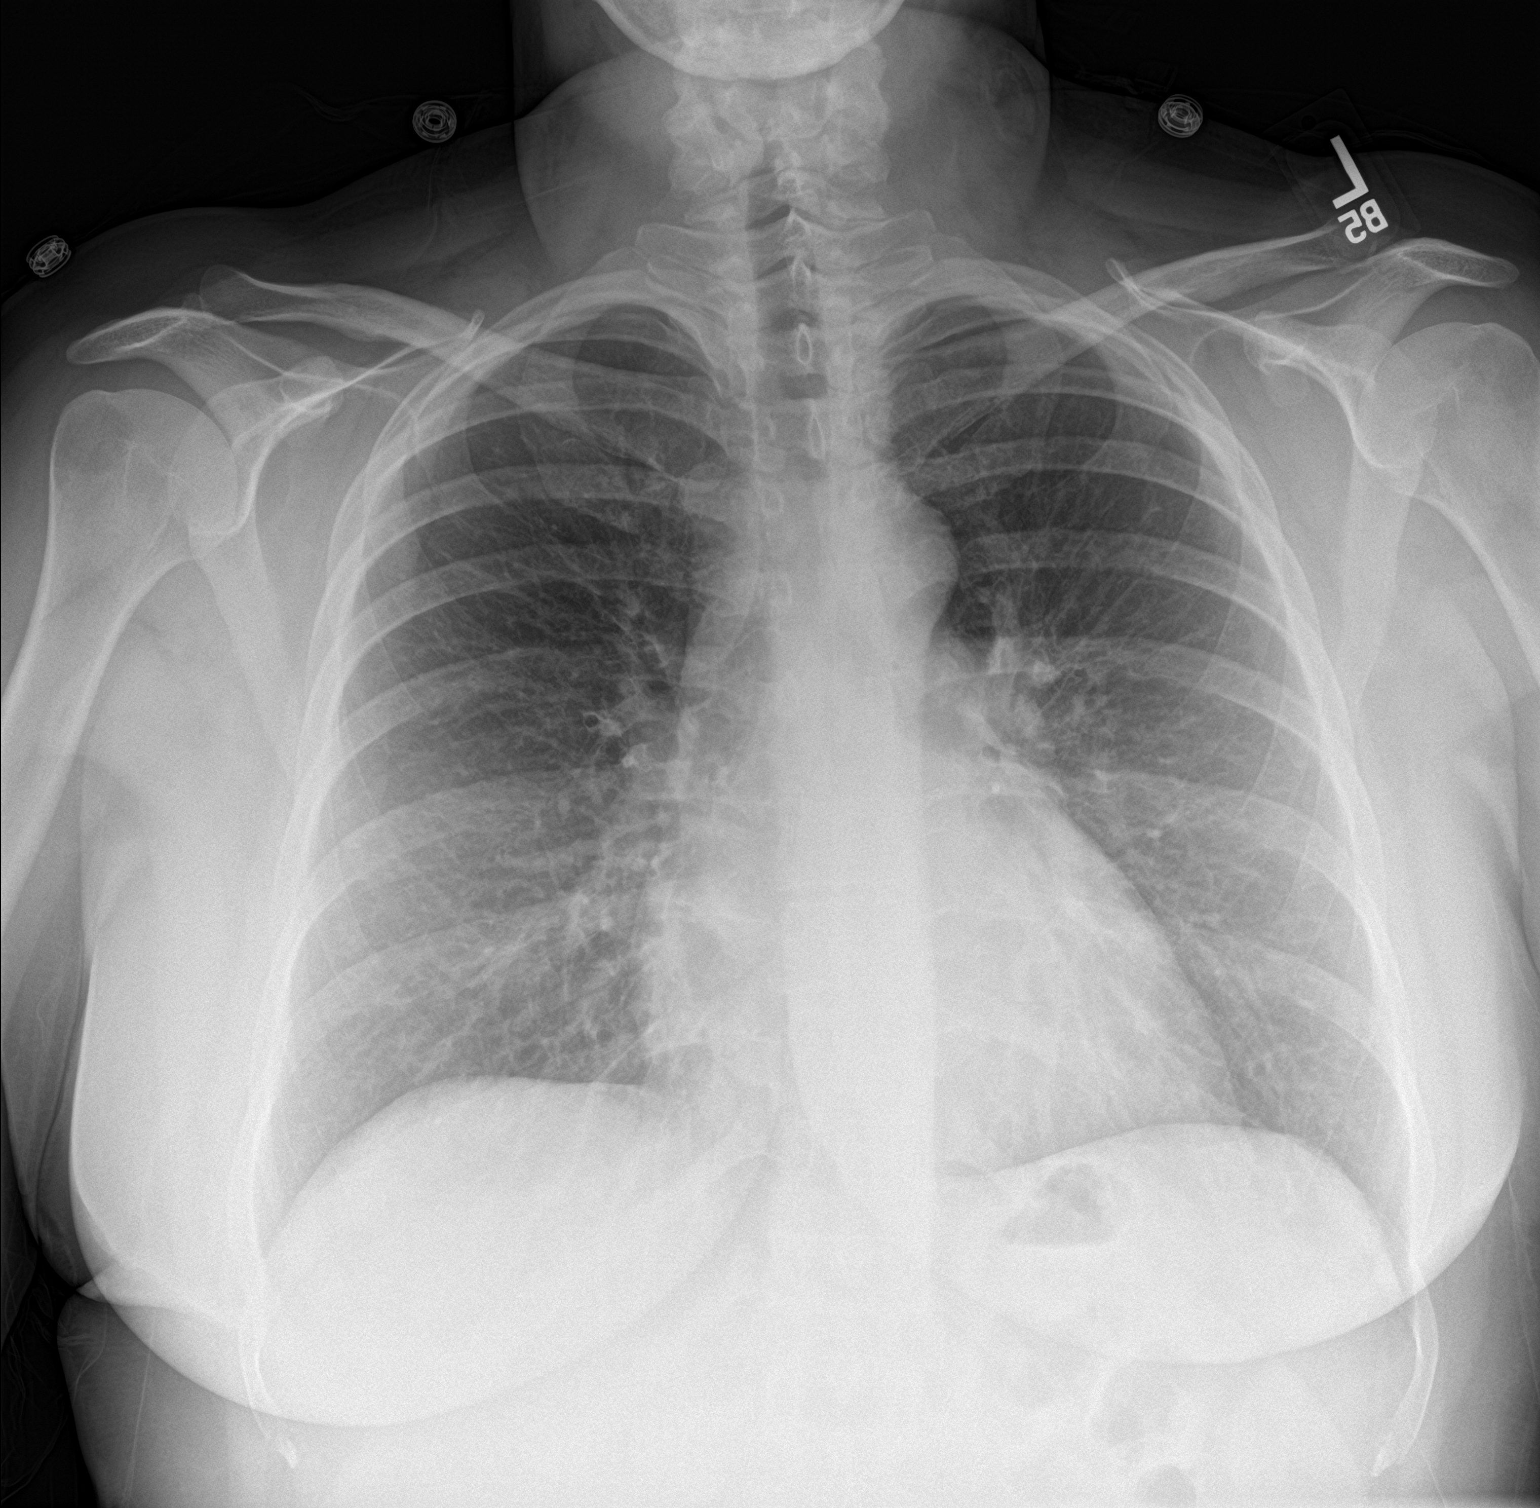

[chest lat]
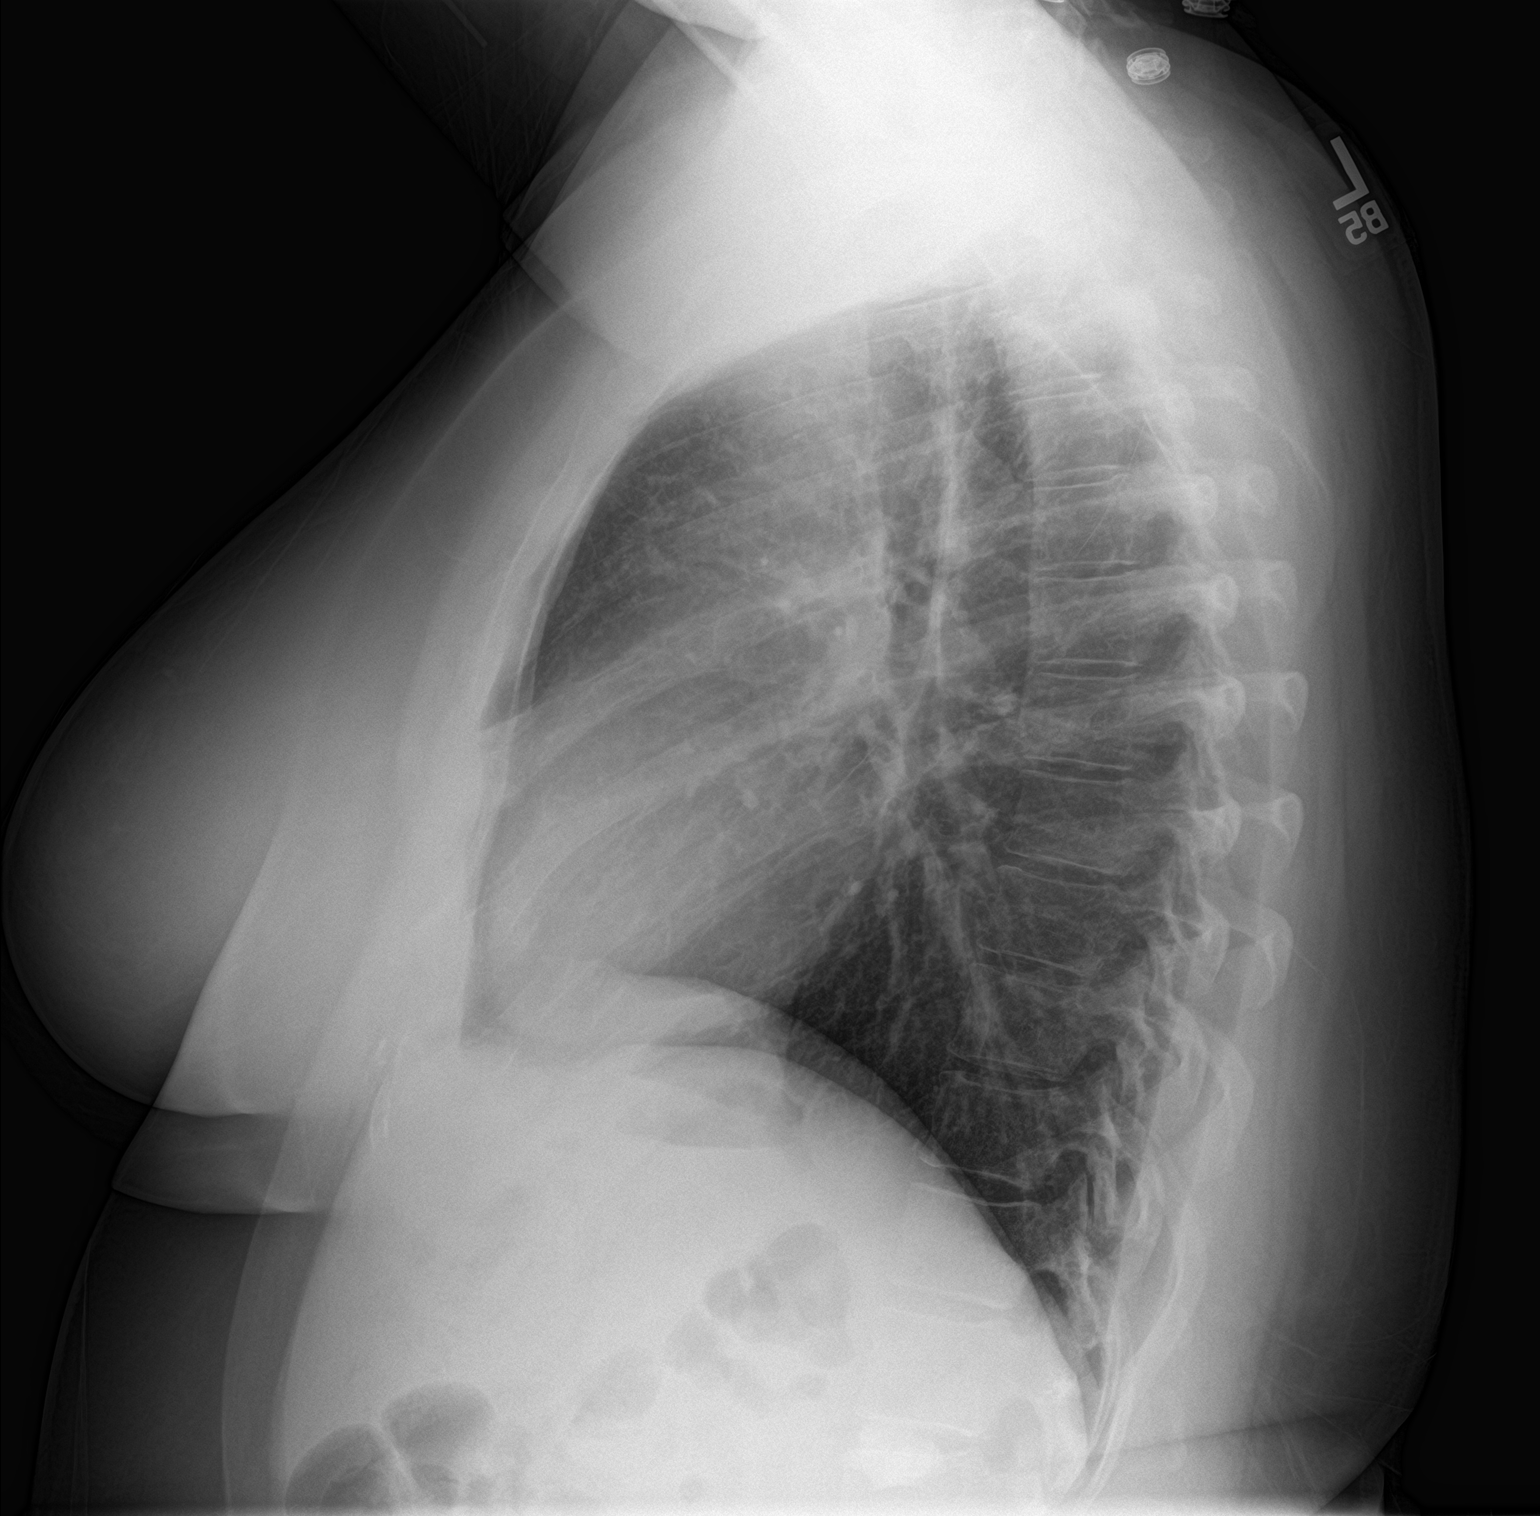

[2 of 2 positions shown; findings below may reference images not displayed]

FINDINGS: Normal heart size. Lungs clear. No pneumothorax. No pleural
effusion.
IMPRESSION: No active cardiopulmonary disease.

## 2022-08-08 IMAGING — CT CT ANGIO CHEST
2 of 11 series · 17 of 36 positions shown · IV contrast (omnipaque)
Comparison: Plain film 11/07/2019 no prior CT.

CLINICAL DATA: Mild chest pain and lower extremity swelling since
yesterday. Hypertension.

EXAM:
CT ANGIOGRAPHY CHEST WITH CONTRAST
TECHNIQUE: Multidetector CT imaging of the chest was performed using the
standard protocol during bolus administration of intravenous
contrast. Multiplanar CT image reconstructions and MIPs were
obtained to evaluate the vascular anatomy.
CONTRAST:  100mL OMNIPAQUE IOHEXOL 350 MG/ML SOLN

[Series 9: pe thins · axial · 0.77mm/px · z∈[-352,-64]mm · 16 of 328 slices shown]
[im 20/328  lung]
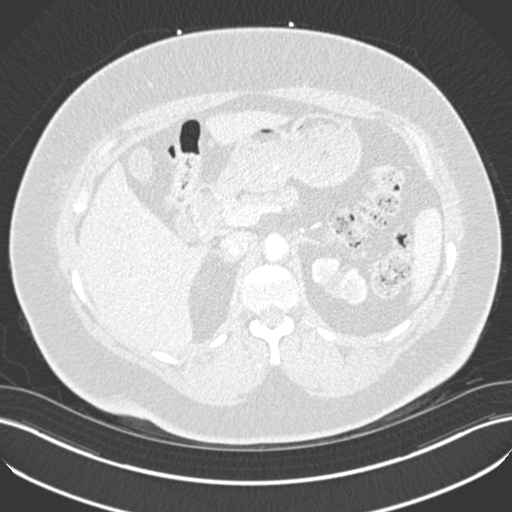
[im 39/328  mediastinal]
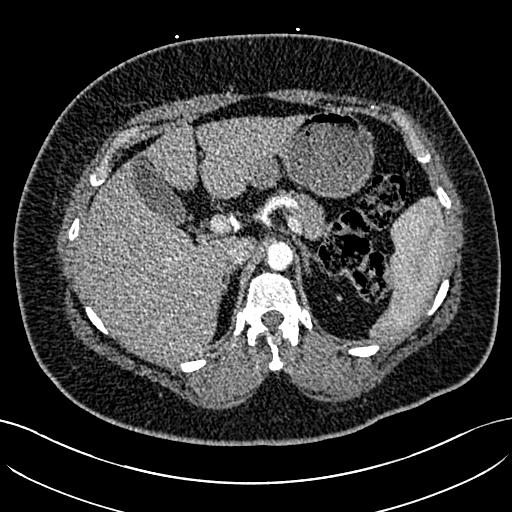
[im 58/328  lung]
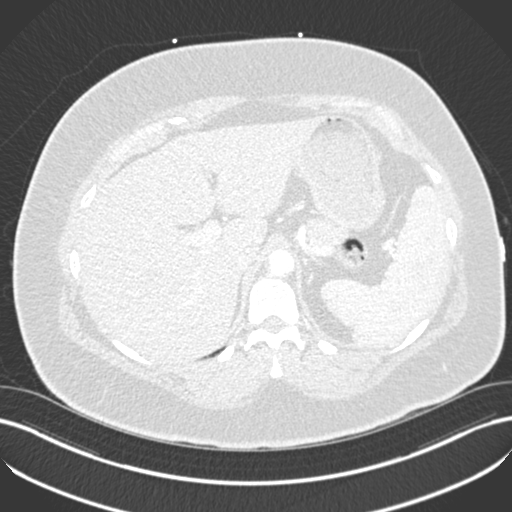
[im 77/328  mediastinal]
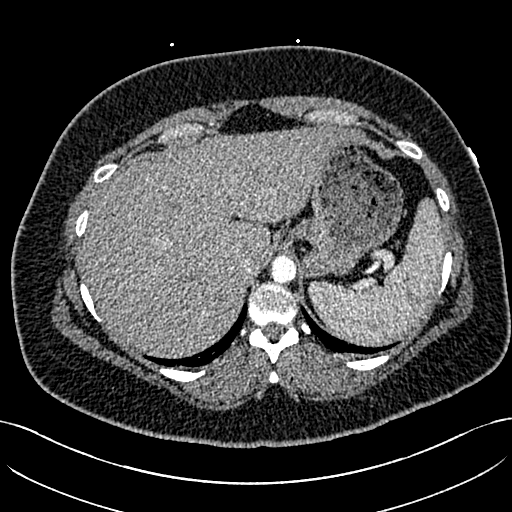
[im 97/328  lung]
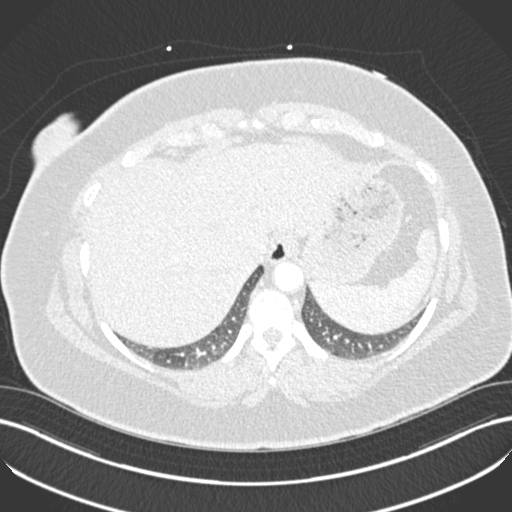
[im 116/328  mediastinal]
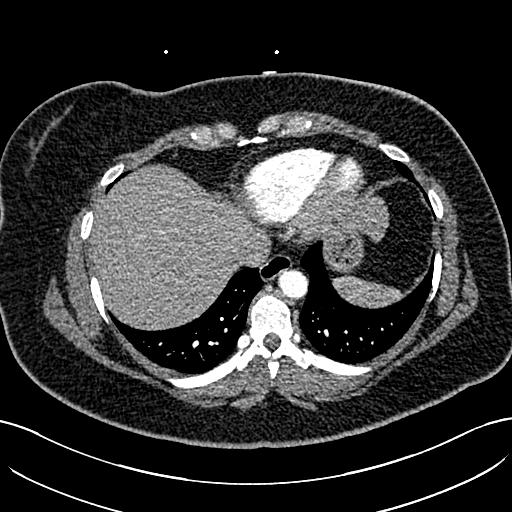
[im 135/328  lung]
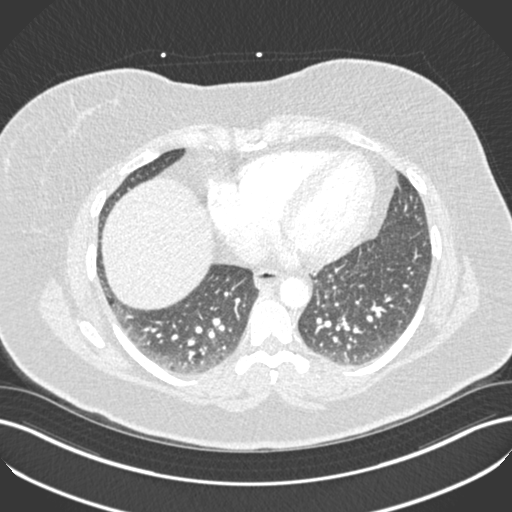
[im 154/328  mediastinal]
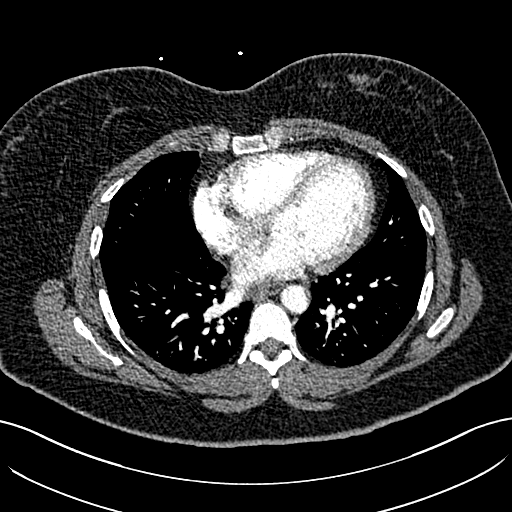
[im 174/328  lung]
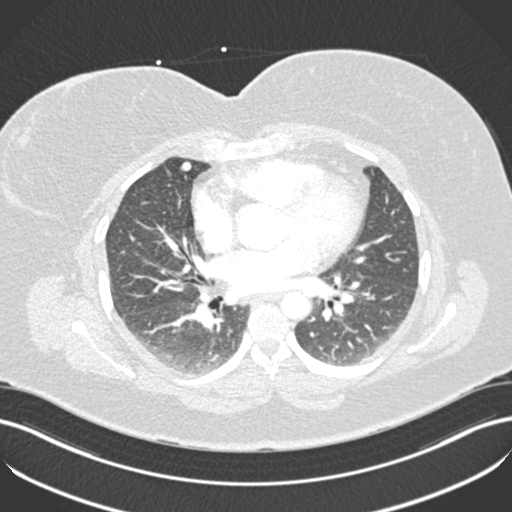
[im 193/328  mediastinal]
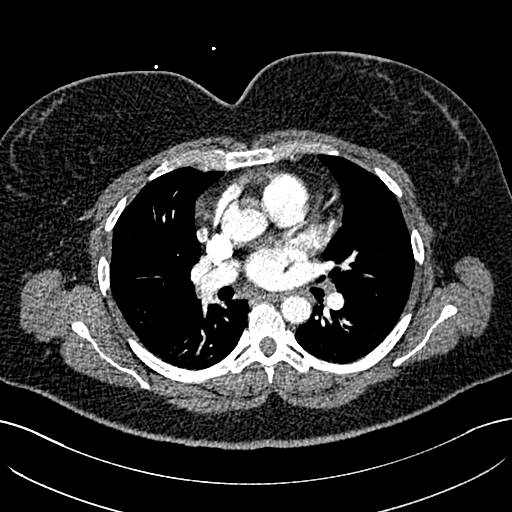
[im 212/328  lung]
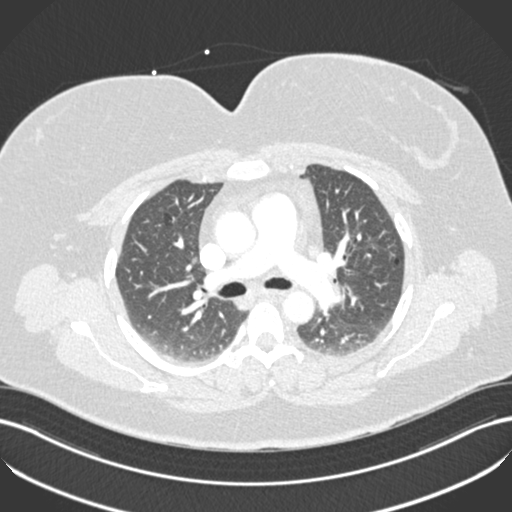
[im 231/328  mediastinal]
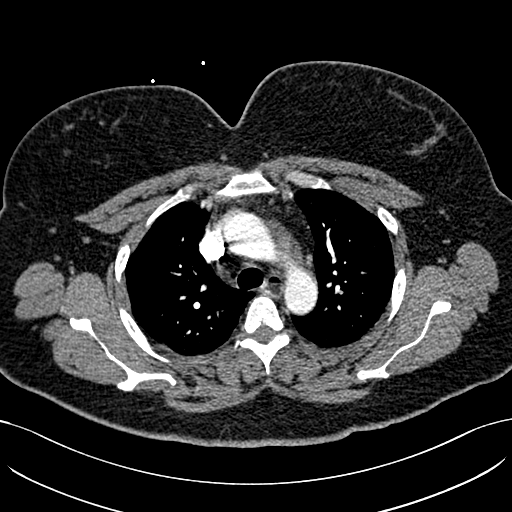
[im 251/328  lung]
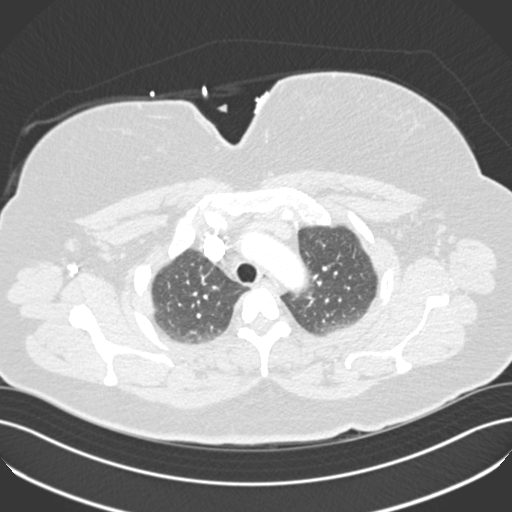
[im 270/328  mediastinal]
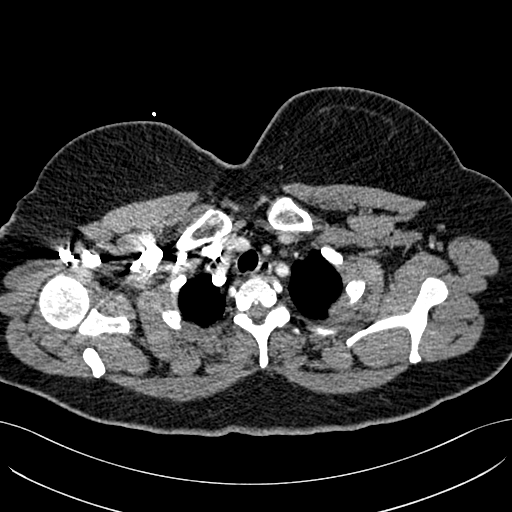
[im 289/328  lung]
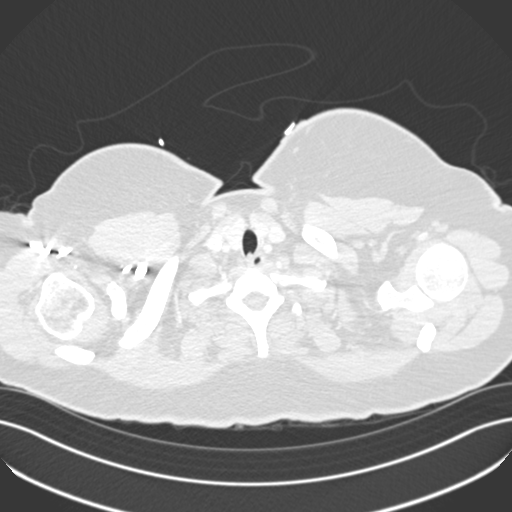
[im 308/328  mediastinal]
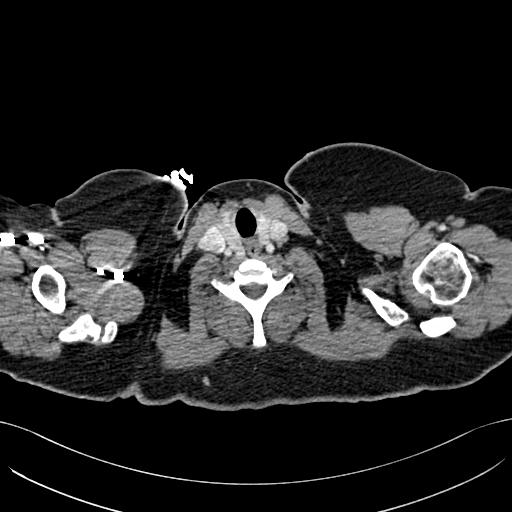

[Series 10: pe coronal mpr · coronal · 0.64mm/px · 1 of 151 slices shown]
[im 76/151  mediastinal]
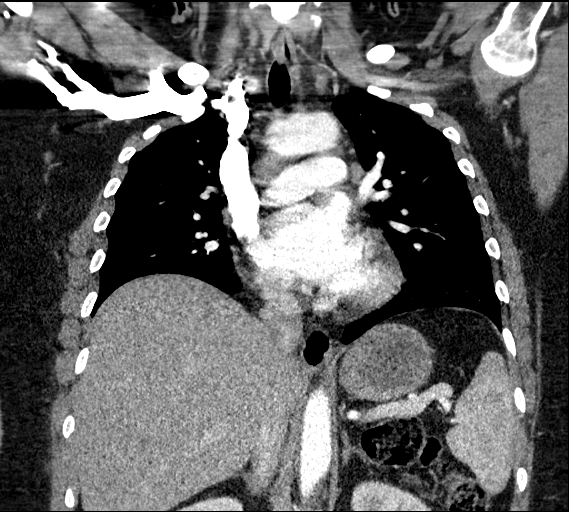

[17 of 36 positions shown; findings below may reference images not displayed]

FINDINGS: Cardiovascular: The quality of this exam for evaluation of pulmonary
embolism is moderate. The bolus is centered in the SVC. No evidence
of pulmonary embolism.

Aortic atherosclerosis. Mild cardiomegaly, without pericardial
effusion.

Mediastinum/Nodes: Low right paratracheal borderline enlarged 1.0 cm
node on 36/7 is likely reactive, within normal variation.

The esophagus is dilated with fluid level within on 32/7.

Lungs/Pleura: No pleural fluid. Nodule along the right minor
fissure, within the inferior right upper lobe, measures 8 mm on 52/8
and demonstrates central calcification, consistent with a benign
granuloma or hamartoma.

Upper Abdomen: Normal imaged portions of the liver, spleen, stomach,
pancreas, gallbladder, adrenal glands, right kidney. Extensive upper
pole left renal cortical scarring.

Musculoskeletal: No acute osseous abnormality.

Review of the MIP images confirms the above findings.
IMPRESSION: 1.  No evidence of pulmonary embolism.
2. Aortic Atherosclerosis ([id number].A). This is significantly age
advanced.
3. Esophageal air fluid level suggests dysmotility or
gastroesophageal reflux.
4. Upper pole left renal scarring.
